# Patient Record
Sex: Female | Born: 1955 | Race: White | Hispanic: No | Marital: Married | Smoking: Never smoker
Health system: Southern US, Community
[De-identification: ages and names within clinical notes are randomized; demographics above are authoritative.]

## PROBLEM LIST (undated history)

## (undated) DIAGNOSIS — I868 Varicose veins of other specified sites: Secondary | ICD-10-CM

## (undated) DIAGNOSIS — M3500 Sicca syndrome, unspecified: Secondary | ICD-10-CM

## (undated) HISTORY — PX: WRIST FRACTURE SURGERY: SHX121

## (undated) HISTORY — PX: CATARACT EXTRACTION, BILATERAL: SHX1313

## (undated) HISTORY — PX: TUBAL LIGATION: SHX77

## (undated) HISTORY — PX: KNEE ARTHROSCOPY W/ MENISCAL REPAIR: SHX1877

## (undated) HISTORY — DX: Sjogren syndrome, unspecified: M35.00

## (undated) HISTORY — DX: Varicose veins of other specified sites: I86.8

## (undated) HISTORY — PX: CHOLECYSTECTOMY: SHX55

---

## 2020-08-31 LAB — HM MAMMOGRAPHY: HM Mammogram: NORMAL (ref 0–4)

## 2021-10-04 ENCOUNTER — Ambulatory Visit: Payer: 59 | Admitting: Family Medicine

## 2021-10-04 ENCOUNTER — Encounter: Payer: Self-pay | Admitting: Family Medicine

## 2021-10-04 ENCOUNTER — Ambulatory Visit (INDEPENDENT_AMBULATORY_CARE_PROVIDER_SITE_OTHER): Payer: 59 | Admitting: Family Medicine

## 2021-10-04 VITALS — BP 116/68 | HR 82 | Ht 67.0 in | Wt 147.8 lb

## 2021-10-04 DIAGNOSIS — M3500 Sicca syndrome, unspecified: Secondary | ICD-10-CM | POA: Diagnosis not present

## 2021-10-04 DIAGNOSIS — Z1211 Encounter for screening for malignant neoplasm of colon: Secondary | ICD-10-CM | POA: Diagnosis not present

## 2021-10-04 DIAGNOSIS — R12 Heartburn: Secondary | ICD-10-CM

## 2021-10-04 DIAGNOSIS — Z9189 Other specified personal risk factors, not elsewhere classified: Secondary | ICD-10-CM | POA: Diagnosis not present

## 2021-10-04 DIAGNOSIS — I83899 Varicose veins of unspecified lower extremities with other complications: Secondary | ICD-10-CM

## 2021-10-04 DIAGNOSIS — E559 Vitamin D deficiency, unspecified: Secondary | ICD-10-CM

## 2021-10-04 DIAGNOSIS — E038 Other specified hypothyroidism: Secondary | ICD-10-CM

## 2021-10-04 DIAGNOSIS — I868 Varicose veins of other specified sites: Secondary | ICD-10-CM

## 2021-10-04 MED ORDER — OMEPRAZOLE 40 MG PO CPDR: 40.0000 mg | DELAYED_RELEASE_CAPSULE | Freq: Every day | ORAL | 1 refills | Status: DC

## 2021-10-04 MED ORDER — LEVOTHYROXINE SODIUM 88 MCG PO TABS
88.0000 ug | ORAL_TABLET | Freq: Every day | ORAL | 1 refills | Status: DC
Start: 2021-10-04 — End: 2022-04-07

## 2021-10-04 MED ORDER — PREDNISONE 5 MG PO TABS: 5.0000 mg | ORAL_TABLET | Freq: Every day | ORAL | 0 refills | Status: DC

## 2021-10-04 NOTE — Patient Instructions (Addendum)
I appreciate the opportunity to provide care to you today!    Follow up:  3 months  Labs: will get labs at next visit (CBC, CMP, TSH, Lipid profile, HgA1c, Vit D)   Please pick up your medications at your pharmacy  Please stop by [facility] hospital anytime to get an x-ray of your bones (DEXA bone scan)   Referrals today- Rheumatologist and Vascular surgery    Please continue to a heart-healthy diet and increase your physical activities. Try to exercise for at least three times a week.      It was a pleasure to see you and I look forward to continuing to work together on your health and well-being. Please do not hesitate to call the office if you need care or have questions about your care.   Have a wonderful day and week. With Gratitude, Gilmore Laroche MSN, FNP-BC

## 2021-10-04 NOTE — Progress Notes (Unsigned)
New Patient Office Visit  Subjective:  Patient ID: Kimberly Singh, female    DOB: [birth year]  Age: 66 y.o. MRN: [id number]  CC:  Chief Complaint  Patient presents with   New Patient (Initial Visit)    Pt establishing care. Pt needs refills. Would like a general work up. Wants to discuss sjogrens flare ups. Has blood clot on right lower leg, not getting any better or worse would like to have this looked at.     HPI Kimberly Singh is a 66 y.o. female with past medical history of sjogrens syndrome presents for establishing care. [place] in supeficila bloot clot,  Dec 2,2022, went to urgent care and toook her to the Ed, told to apply warm compressess and went to her Md because it wasn't going away and staes that it want urgent.... 5 monthns.  RLQ -has spider vein in the feet that runs in the family, the veins in the feet itches Sensitive on arms and face, no rash  Has not followed up in a long up with a rheumatologist in a long time  Had labs 1 monthn ago Sjogrens flair Up:  last week Tuesday: took: topical cortizone cream and resulted ti prednizon tooks 1 in the morening and and took  cheecks get itcy and swwolllen, arms were itchy no dry eyes or dry mouth. eyes feelsl ike closing, rhemologist guve her renidison. Took last predsionn last 2days.    Had mammogram last year No past medical history on file.  *** The histories are not reviewed yet. Please review them in the "History" navigator section and refresh this SmartLink.  No family history on file.  Social History   Socioeconomic History   Marital status: Married    Spouse name: Not on file   Number of children: Not on file   Years of education: Not on file   Highest education level: Not on file  Occupational History   Not on file  Tobacco Use   Smoking status: Never   Smokeless tobacco: Never  Substance and Sexual Activity   Alcohol use: Never   Drug use: Never   Sexual activity: Not on file  Other Topics Concern   Not on  file  Social History Narrative   Not on file   Social Determinants of Health   Financial Resource Strain: Not on file  Food Insecurity: Not on file  Transportation Needs: Not on file  Physical Activity: Not on file  Stress: Not on file  Social Connections: Not on file  Intimate Partner Violence: Not on file    ROS Review of Systems  Constitutional:  Negative for chills, fatigue and fever.  HENT:  Negative for sinus pressure, sinus pain, sneezing and sore throat.   Eyes:  Negative for pain, discharge, redness, itching and visual disturbance.  Respiratory:  Negative for cough, choking, chest tightness and shortness of breath.   Cardiovascular:  Positive for palpitations (sometimes( none today)). Negative for chest pain.  Gastrointestinal:  Negative for constipation, diarrhea, nausea and vomiting.  Endocrine: Negative for polydipsia, polyphagia and polyuria.  Genitourinary:  Negative for dysuria and urgency.  Musculoskeletal:  Negative for back pain and neck pain.  Skin:  Positive for rash. Negative for wound.  Neurological:  Negative for dizziness, weakness, numbness and headaches.  Psychiatric/Behavioral:  Negative for confusion, self-injury, sleep disturbance and suicidal ideas.    Objective:   Today's Vitals: BP 116/68   Pulse 82   Ht 5\' 7"  (1.702 m)   Wt 147 lb  12.8 oz (67 kg)   SpO2 96%   BMI 23.15 kg/m   Physical Exam Neurological:     Mental Status: She is oriented to person, place, and time.    Assessment & Plan:   Problem List Items Addressed This Visit   None Visit Diagnoses     Colon cancer screening    -  Primary   Relevant Orders   Cologuard       Outpatient Encounter Medications as of 10/04/2021  Medication Sig   levothyroxine (SYNTHROID) 88 MCG tablet Take 88 mcg by mouth daily before breakfast.   losartan (COZAAR) 50 MG tablet Take 50 mg by mouth daily.   omeprazole (PRILOSEC) 40 MG capsule Take 40 mg by mouth daily.   predniSONE (DELTASONE)  5 MG tablet Take 5 mg by mouth daily with breakfast.   No facility-administered encounter medications on file as of 10/04/2021.    Follow-up: No follow-ups on file.   Gilmore Laroche, FNP

## 2021-10-06 ENCOUNTER — Encounter: Payer: Self-pay | Admitting: Family Medicine

## 2021-10-06 DIAGNOSIS — I868 Varicose veins of other specified sites: Secondary | ICD-10-CM | POA: Insufficient documentation

## 2021-10-06 DIAGNOSIS — M3500 Sicca syndrome, unspecified: Secondary | ICD-10-CM | POA: Insufficient documentation

## 2021-10-06 NOTE — Assessment & Plan Note (Signed)
-  given patient's history and assessment findings, will refer the patient to rheumatology for continuous follow-up of her condition

## 2021-10-06 NOTE — Assessment & Plan Note (Signed)
-  Assessment findings likely of ruptured varicose vein -Patient denies pain with ambulation, tenderness, swelling, and warmth. -Will refer the patient to vascular surgery for further evaluation

## 2021-10-12 ENCOUNTER — Other Ambulatory Visit: Payer: Self-pay

## 2021-10-12 ENCOUNTER — Telehealth: Payer: Self-pay

## 2021-10-12 DIAGNOSIS — M3500 Sicca syndrome, unspecified: Secondary | ICD-10-CM

## 2021-10-12 MED ORDER — PREDNISONE 5 MG PO TABS: 5.0000 mg | ORAL_TABLET | Freq: Every day | ORAL | 0 refills | Status: DC

## 2021-10-12 NOTE — Telephone Encounter (Signed)
yes

## 2021-10-12 NOTE — Telephone Encounter (Signed)
Patient need med refillpredniSONE (DELTASONE) 5 MG tablet  Had another flare, will upload photo in [place]  Pharmacy: [facility]

## 2021-10-12 NOTE — Telephone Encounter (Signed)
Pt informed

## 2021-10-12 NOTE — Telephone Encounter (Signed)
Ok to fill 

## 2021-10-13 ENCOUNTER — Ambulatory Visit (HOSPITAL_COMMUNITY)
Admission: RE | Admit: 2021-10-13 | Discharge: 2021-10-13 | Disposition: A | Discharge status: Home / Self Care | Payer: 59 | Source: Ambulatory Visit | Attending: Family Medicine | Admitting: Family Medicine

## 2021-10-13 DIAGNOSIS — Z9189 Other specified personal risk factors, not elsewhere classified: Secondary | ICD-10-CM | POA: Insufficient documentation

## 2021-10-14 NOTE — Progress Notes (Signed)
Please, inform the patient that her Dexa Scan shows that she's osteopenic. I recommend OTC vit D 600iu daily and Calcium 1200mg  daily for bone health.

## 2021-10-30 ENCOUNTER — Other Ambulatory Visit: Payer: Self-pay

## 2021-10-30 DIAGNOSIS — I868 Varicose veins of other specified sites: Secondary | ICD-10-CM

## 2021-11-17 ENCOUNTER — Ambulatory Visit (INDEPENDENT_AMBULATORY_CARE_PROVIDER_SITE_OTHER): Payer: Medicare Other | Admitting: Family Medicine

## 2021-11-17 ENCOUNTER — Encounter: Payer: Self-pay | Admitting: Family Medicine

## 2021-11-17 VITALS — BP 148/84 | HR 83 | Ht 67.5 in | Wt 144.8 lb

## 2021-11-17 DIAGNOSIS — M3500 Sicca syndrome, unspecified: Secondary | ICD-10-CM | POA: Diagnosis not present

## 2021-11-17 DIAGNOSIS — L299 Pruritus, unspecified: Secondary | ICD-10-CM

## 2021-11-17 DIAGNOSIS — I868 Varicose veins of other specified sites: Secondary | ICD-10-CM

## 2021-11-17 DIAGNOSIS — R21 Rash and other nonspecific skin eruption: Secondary | ICD-10-CM | POA: Diagnosis not present

## 2021-11-17 MED ORDER — PREDNISONE 5 MG PO TABS: 5.0000 mg | ORAL_TABLET | Freq: Every day | ORAL | 0 refills | Status: DC

## 2021-11-17 MED ORDER — HYDROXYZINE PAMOATE 25 MG PO CAPS: 25.0000 mg | ORAL_CAPSULE | Freq: Three times a day (TID) | ORAL | 0 refills | Status: DC | PRN

## 2021-11-17 NOTE — Patient Instructions (Signed)
I appreciate the opportunity to provide care to you today!    Follow up:  1 month CPE  Please pick up your medications at the pharmacy   Please continue to a heart-healthy diet and increase your physical activities. Try to exercise for at least three times a week.      It was a pleasure to see you and I look forward to continuing to work together on your health and well-being. Please do not hesitate to call the office if you need care or have questions about your care.   Have a wonderful day and week. With Gratitude, Gilmore Laroche MSN, FNP-BC

## 2021-11-17 NOTE — Progress Notes (Signed)
Established Patient Office Visit  Subjective:  Patient ID: Kimberly Singh, female    DOB: 1955/08/19  Age: 66 y.o. MRN: [id number]  CC:  Chief Complaint  Patient presents with   Rash    Pt c/o of flare of rash from being out in the sun.     HPI Kimberly Singh is a 66 y.o. female with past medical history of sjogren's syndrome presents  with c/o of pruritic rash on her face, arms, and body from being in the sun. The rash has been resolved. The onset of symptoms was on November 08, 2021. He noted having heat hives on her body. She applied Kenalog cream and itch lotion to relieve symptoms. She reports to have completed her oral prednisone and will like a refill.  Past Medical History:  Diagnosis Date   Sjogren's syndrome ([place])    Spider varicose veins     History reviewed. No pertinent surgical history.  History reviewed. No pertinent family history.  Social History   Socioeconomic History   Marital status: Married    Spouse name: Not on file   Number of children: Not on file   Years of education: Not on file   Highest education level: Not on file  Occupational History   Not on file  Tobacco Use   Smoking status: Never   Smokeless tobacco: Never  Substance and Sexual Activity   Alcohol use: Never   Drug use: Never   Sexual activity: Not on file  Other Topics Concern   Not on file  Social History Narrative   Not on file   Social Determinants of Health   Financial Resource Strain: Not on file  Food Insecurity: Not on file  Transportation Needs: Not on file  Physical Activity: Not on file  Stress: Not on file  Social Connections: Not on file  Intimate Partner Violence: Not on file    Outpatient Medications Prior to Visit  Medication Sig Dispense Refill   camphor-menthol (SARNA) lotion Apply 1 Application topically as needed for itching.     levothyroxine (SYNTHROID) 88 MCG tablet Take 1 tablet (88 mcg total) by mouth daily before breakfast. 90 tablet 1   losartan  (COZAAR) 50 MG tablet Take 50 mg by mouth daily.     omeprazole (PRILOSEC) 40 MG capsule Take 1 capsule (40 mg total) by mouth daily. 90 capsule 1   triamcinolone cream (KENALOG) 0.1 % Apply 1 Application topically 2 (two) times daily.     predniSONE (DELTASONE) 5 MG tablet Take 1 tablet (5 mg total) by mouth daily with breakfast. 10 tablet 0   No facility-administered medications prior to visit.    No Known Allergies  ROS Review of Systems  Constitutional:  Negative for fatigue and fever.  Respiratory:  Positive for chest tightness. Negative for shortness of breath.   Skin:  Positive for rash.      Objective:    Physical Exam HENT:     Head: Normocephalic.  Cardiovascular:     Rate and Rhythm: Normal rate and regular rhythm.     Pulses: Normal pulses.     Heart sounds: Normal heart sounds.  Pulmonary:     Effort: Pulmonary effort is normal.     Breath sounds: Normal breath sounds.  Skin:    Findings: No rash.  Neurological:     Mental Status: She is alert.     BP (!) 148/84   Pulse 83   Ht 5' 7.5" (1.715 m)   Wt 144  lb 12.8 oz (65.7 kg)   SpO2 99%   BMI 22.34 kg/m  Wt Readings from Last 3 Encounters:  11/17/21 144 lb 12.8 oz (65.7 kg)  10/04/21 147 lb 12.8 oz (67 kg)    No results found for: "TSH" No results found for: "WBC", "HGB", "HCT", "MCV", "PLT" No results found for: "NA", "K", "CHLORIDE", "CO2", "GLUCOSE", "BUN", "CREATININE", "BILITOT", "ALKPHOS", "AST", "ALT", "PROT", "ALBUMIN", "CALCIUM", "ANIONGAP", "EGFR", "GFR" No results found for: "CHOL" No results found for: "HDL" No results found for: "LDLCALC" No results found for: "TRIG" No results found for: "CHOLHDL" No results found for: "HGBA1C"    Assessment & Plan:   Problem List Items Addressed This Visit       Cardiovascular and Mediastinum   Spider varicose veins    F/u with vascular surgery on 11/29/21        Musculoskeletal and Integument   Rash of body    Noted having a pruritic  rash on her face, arms, and body from being in the sun on November 08, 2021 The rash has been resolved She applied Kenalog cream and itch lotion to relieve the symptoms Will order Hydroxyzine 25 mg to treat skin rash, hives and itching        Other   Sjogren's syndrome ([place])    Report faxing medical records to the rheumatologist's office Will be f/u with rheumatology soon Refilled prednisone       Relevant Medications   predniSONE (DELTASONE) 5 MG tablet   Other Visit Diagnoses     Pruritic dermatitis    -  Primary   Relevant Medications   hydrOXYzine (VISTARIL) 25 MG capsule       Meds ordered this encounter  Medications   hydrOXYzine (VISTARIL) 25 MG capsule    Sig: Take 1 capsule (25 mg total) by mouth every 8 (eight) hours as needed.    Dispense:  30 capsule    Refill:  0   predniSONE (DELTASONE) 5 MG tablet    Sig: Take 1 tablet (5 mg total) by mouth daily with breakfast.    Dispense:  5 tablet    Refill:  0    Follow-up: Return in about 1 month (around 12/18/2021) for CPE.    Alvira Monday, FNP

## 2021-11-17 NOTE — Assessment & Plan Note (Signed)
F/u with vascular surgery on 11/29/21

## 2021-11-17 NOTE — Assessment & Plan Note (Signed)
Report faxing medical records to the rheumatologist's office Will be f/u with rheumatology soon Refilled prednisone

## 2021-11-17 NOTE — Assessment & Plan Note (Addendum)
Noted having a pruritic rash on her face, arms, and body from being in the sun on November 08, 2021 The rash has been resolved She applied Kenalog cream and itch lotion to relieve the symptoms Will order Hydroxyzine 25 mg to treat skin rash, hives and itching

## 2021-11-24 ENCOUNTER — Encounter: Payer: Self-pay | Admitting: Family Medicine

## 2021-11-25 ENCOUNTER — Ambulatory Visit (INDEPENDENT_AMBULATORY_CARE_PROVIDER_SITE_OTHER): Payer: Medicare Other | Admitting: Nurse Practitioner

## 2021-11-25 ENCOUNTER — Encounter: Payer: Self-pay | Admitting: Nurse Practitioner

## 2021-11-25 DIAGNOSIS — A601 Herpesviral infection of perianal skin and rectum: Secondary | ICD-10-CM

## 2021-11-25 DIAGNOSIS — A6000 Herpesviral infection of urogenital system, unspecified: Secondary | ICD-10-CM | POA: Insufficient documentation

## 2021-11-25 DIAGNOSIS — B009 Herpesviral infection, unspecified: Secondary | ICD-10-CM | POA: Insufficient documentation

## 2021-11-25 MED ORDER — ACYCLOVIR 400 MG PO TABS: 400.0000 mg | ORAL_TABLET | Freq: Three times a day (TID) | ORAL | 0 refills | Status: DC

## 2021-11-25 NOTE — Assessment & Plan Note (Signed)
Rx Zovirax 400 mg 3 times daily for 10 days Use of cold compresses and sits baths encouraged. Patient told to call the office if her symptoms does not improve after completion of full course of Zovirax

## 2021-11-25 NOTE — Progress Notes (Addendum)
   Kimberly Singh     MRN: [id number]      DOB: [birth year]   HPI Kimberly Singh with past medical history of genital herpes, sjogrens syndrome is here for complaints of genital herpes since about a week ago.  Patient stated that she does have history of genital herpes and that her last episode prior to now  was around 3 years ago.  Patient denies fever, chills, malaise.  States that she initially had blisters on her anal area , she has been applying cold compress to her anal area  and taking ibuprofen as needed.        ROS Denies recent fever or chills. Denies sinus pressure, nasal congestion, ear pain or sore throat. Denies chest congestion, productive cough or wheezing. Denies chest pains, palpitations and leg swelling Denies abdominal pain, nausea, vomiting,diarrhea or constipation.   Denies dysuria, frequency, hesitancy or incontinence. Denies joint pain, swelling and limitation in mobility. Denies headaches, seizures, numbness, or tingling. Denies depression, anxiety or insomnia.    PE  BP (!) 148/74 (BP Location: Right Arm, Cuff Size: Normal)   Pulse 72   Ht 5\' 7"  (1.702 m)   Wt 146 lb (66.2 kg)   SpO2 98%   BMI 22.87 kg/m   CMA present as chaperone  Patient alert and oriented and in no cardiopulmonary distress.  Chest: Clear to auscultation bilaterally.  CVS: S1, S2 no murmurs, no S3.Regular rate.  ABD: Soft non tender.   Ext: No edema  MS: Adequate ROM spine, shoulders, hips and knees.  Skin:  skin around the anus appears moist and erythematous, no blisters or drainage noted.   Psych: Good eye contact, normal affect. Memory intact not anxious or depressed appearing.  CNS: CN 2-12 intact, power,  normal throughout.no focal deficits noted.   Assessment & Plan  Herpes genitalia Rx Zovirax 400 mg 3 times daily for 10 days Use of cold compresses and sits baths encouraged. Patient told to call the office if her symptoms does not improve after completion of  full course of Zovirax

## 2021-11-25 NOTE — Patient Instructions (Addendum)
   Please take acyclovir 400mg  three times daily for 10 days.   Apply cool compress , cool perineal baths and sitsz bath as needed .     Thanks for choosing [facility], we consider it a privelige to serve you.

## 2021-11-28 NOTE — Progress Notes (Unsigned)
[facility] [place]  ASSESSMENT / PLAN: Kimberly Singh is a 66 y.o. female with chronic venous insufficiency of right lower extremity causing spontaneous bleeding from reticular veins Venous duplex is significant for GSV reflux in the thigh, knee, and calf. Recommend compression and elevation for symptomatic relief. Follow up with [facility] in three months to discuss vein interventions available for her.  CHIEF COMPLAINT: Spontaneous bleeding from right ankle reticular vein  HISTORY OF PRESENT ILLNESS: Kimberly Singh is a 66 y.o. female referred to clinic for evaluation of chronic venous insufficiency.  The patient reports she had an episode of spontaneous bleeding from her right ankle in the area of prominent reticular veins.  She has suffered with chronic venous insufficiency for some time.  She is bothered by the appearance of her legs.  She has tried compression, but has not tried fitted compression and finds the compression uncomfortable.  She has tried elevating her legs and this does provide her some relief.  Past Medical History:  Diagnosis Date   Sjogren's syndrome ([facility])    Spider varicose veins     History reviewed. No pertinent surgical history.  History reviewed. No pertinent family history.  Social History   Socioeconomic History   Marital status: Married    Spouse name: Not on file   Number of children: Not on file   Years of education: Not on file   Highest education level: Not on file  Occupational History   Not on file  Tobacco Use   Smoking status: Never   Smokeless tobacco: Never  Substance and Sexual Activity   Alcohol use: Never   Drug use: Never   Sexual activity: Not on file  Other Topics Concern   Not on file  Social History Narrative   Not on file   Social Determinants of Health   Financial Resource Strain: Not on file  Food Insecurity: Not on file  Transportation Needs: Not on file  Physical Activity: Not on file  Stress: Not  on file  Social Connections: Not on file  Intimate Partner Violence: Not on file    No Known Allergies  Current Outpatient Medications  Medication Sig Dispense Refill   acyclovir (ZOVIRAX) 400 MG tablet Take 1 tablet (400 mg total) by mouth 3 (three) times daily for 10 days. 30 tablet 0   Ascorbic Acid (VITAMIN C PO) Take by mouth. Once daily     camphor-menthol (SARNA) lotion Apply 1 Application topically as needed for itching.     Cholecalciferol (VITAMIN D3 PO) Take by mouth. Once daily     hydrOXYzine (VISTARIL) 25 MG capsule Take 1 capsule (25 mg total) by mouth every 8 (eight) hours as needed. 30 capsule 0   levothyroxine (SYNTHROID) 88 MCG tablet Take 1 tablet (88 mcg total) by mouth daily before breakfast. 90 tablet 1   losartan (COZAAR) 50 MG tablet Take 50 mg by mouth daily.     Multiple Vitamin (MULTIVITAMIN ADULT PO) Take by mouth. daily     omeprazole (PRILOSEC) 40 MG capsule Take 1 capsule (40 mg total) by mouth daily. 90 capsule 1   VITAMIN E PO Take by mouth. Once daily     predniSONE (DELTASONE) 5 MG tablet Take 1 tablet (5 mg total) by mouth daily with breakfast. (Patient not taking: Reported on 11/29/2021) 5 tablet 0   triamcinolone cream (KENALOG) 0.1 % Apply 1 Application topically 2 (two) times daily. (Patient not taking: Reported on 11/29/2021)     No current  facility-administered medications for this visit.    PHYSICAL EXAM Vitals:   11/29/21 1511  BP: (!) 149/67  Pulse: 80  Resp: 20  Temp: 98.3 F (36.8 C)  SpO2: 100%  Weight: 145 lb (65.8 kg)  Height: 5\' 7"  (1.702 m)    Appearing woman in no acute distress Regular rate and rhythm Unlabored breathing Soft, nontender abdomen Prominent venous varicosities, reticular veins about both lower extremities.  The skin over the right medial malleolus has dyspigmentation consistent with hemosiderin staining.  PERTINENT LABORATORY AND RADIOLOGIC DATA  Right lower extremity venous duplex:  - No evidence of  deep vein thrombosis seen in the right lower extremity,  from the common femoral through the popliteal veins.  - No evidence of superficial venous thrombosis in the right lower  extremity.     - Venous reflux is noted in the right greater saphenous vein in the thigh.  - Venous reflux is noted in the right greater saphenous vein in the calf.  - Venous reflux is noted in the right popliteal vein.  - Venous reflux is noted in the right perforator vein.   . Rande Brunt, MD [facility] Phone Number: [phone] 11/29/2021 4:23 PM  Total time spent on preparing this encounter including chart review, data review, collecting history, examining the patient, coordinating care for this new patient, 60 minutes.  Portions of this report may have been transcribed using voice recognition software.  Every effort has been made to ensure accuracy; however, inadvertent computerized transcription errors may still be present.

## 2021-11-29 ENCOUNTER — Ambulatory Visit (HOSPITAL_COMMUNITY)
Admission: RE | Admit: 2021-11-29 | Discharge: 2021-11-29 | Disposition: A | Discharge status: Home / Self Care | Payer: Medicare Other | Source: Ambulatory Visit | Attending: Vascular Surgery | Admitting: Vascular Surgery

## 2021-11-29 ENCOUNTER — Encounter: Payer: Self-pay | Admitting: Vascular Surgery

## 2021-11-29 ENCOUNTER — Ambulatory Visit (INDEPENDENT_AMBULATORY_CARE_PROVIDER_SITE_OTHER): Payer: Medicare Other | Admitting: Vascular Surgery

## 2021-11-29 VITALS — BP 149/67 | HR 80 | Temp 98.3°F | Resp 20 | Ht 67.0 in | Wt 145.0 lb

## 2021-11-29 DIAGNOSIS — I872 Venous insufficiency (chronic) (peripheral): Secondary | ICD-10-CM | POA: Diagnosis not present

## 2021-11-29 DIAGNOSIS — I868 Varicose veins of other specified sites: Secondary | ICD-10-CM | POA: Diagnosis present

## 2021-12-06 ENCOUNTER — Telehealth: Payer: Self-pay

## 2021-12-06 NOTE — Telephone Encounter (Signed)
Pt called stating that she was experiencing periodic itching at the spider veins on her ankles. She's tried several things with no success.  Reviewed pt's chart, returned pt's call for clarification, two identifiers used. Pt stated that she's used lotions, surgeon's skin, and a non-steroidal itch cream. She denies leg dryness.  Spoke with Dr Lenell Antu for additional recommendations. He advised the pt to try a steroidal itch cream. Called pt and informed her of advice. Confirmed understanding.

## 2021-12-19 ENCOUNTER — Other Ambulatory Visit: Payer: Self-pay

## 2021-12-19 ENCOUNTER — Telehealth: Payer: Self-pay | Admitting: Family Medicine

## 2021-12-19 DIAGNOSIS — I10 Essential (primary) hypertension: Secondary | ICD-10-CM

## 2021-12-19 DIAGNOSIS — I270 Primary pulmonary hypertension: Secondary | ICD-10-CM

## 2021-12-19 DIAGNOSIS — R12 Heartburn: Secondary | ICD-10-CM

## 2021-12-19 MED ORDER — LOSARTAN POTASSIUM 50 MG PO TABS: 50.0000 mg | ORAL_TABLET | Freq: Every day | ORAL | 1 refills | Status: DC

## 2021-12-19 NOTE — Telephone Encounter (Signed)
Attempted to reach pt unable to get a hold of her.

## 2021-12-19 NOTE — Telephone Encounter (Signed)
Pt called stating she is needing a refill on . She is completely out. She was prescibed these from her dr in [place]. She is scheduled to see [place] on 01/04/22. Can you please refill or either give her enough to last till her appt?       [facility] [address]

## 2021-12-20 NOTE — Telephone Encounter (Signed)
Spoke to pt she was needing refill for htn medication, rx sent.

## 2021-12-21 ENCOUNTER — Telehealth: Payer: Self-pay

## 2021-12-21 NOTE — Telephone Encounter (Signed)
Patient will come by the office and sign a release to get her mammogram on  04.26.2022 from [place].  Per patient she is age 66 year and older do it every 2 years.

## 2021-12-21 NOTE — Telephone Encounter (Signed)
Noted  

## 2021-12-26 ENCOUNTER — Telehealth: Payer: Self-pay | Admitting: Family Medicine

## 2021-12-26 NOTE — Telephone Encounter (Signed)
Error

## 2022-01-04 ENCOUNTER — Ambulatory Visit (INDEPENDENT_AMBULATORY_CARE_PROVIDER_SITE_OTHER): Payer: Medicare Other | Admitting: Family Medicine

## 2022-01-04 ENCOUNTER — Encounter: Payer: Self-pay | Admitting: Family Medicine

## 2022-01-04 VITALS — BP 124/79 | HR 79 | Ht 67.0 in | Wt 144.1 lb

## 2022-01-04 DIAGNOSIS — M25511 Pain in right shoulder: Secondary | ICD-10-CM

## 2022-01-04 DIAGNOSIS — M3500 Sicca syndrome, unspecified: Secondary | ICD-10-CM

## 2022-01-04 DIAGNOSIS — G8929 Other chronic pain: Secondary | ICD-10-CM | POA: Insufficient documentation

## 2022-01-04 DIAGNOSIS — R7301 Impaired fasting glucose: Secondary | ICD-10-CM

## 2022-01-04 DIAGNOSIS — Z1159 Encounter for screening for other viral diseases: Secondary | ICD-10-CM

## 2022-01-04 DIAGNOSIS — E785 Hyperlipidemia, unspecified: Secondary | ICD-10-CM | POA: Diagnosis not present

## 2022-01-04 DIAGNOSIS — I868 Varicose veins of other specified sites: Secondary | ICD-10-CM

## 2022-01-04 DIAGNOSIS — E559 Vitamin D deficiency, unspecified: Secondary | ICD-10-CM

## 2022-01-04 MED ORDER — DICLOFENAC SODIUM 1 % EX GEL: 2.0000 g | Freq: Four times a day (QID) | CUTANEOUS | 0 refills | Status: DC

## 2022-01-04 NOTE — Assessment & Plan Note (Signed)
Reports right shoulder pain from repetitive movement of the right shoulder from painting her home this weekend Pain is worse at night time and with overhead movements She has been increasing her physical activities by working out at the gym and has not rested the affected arm since the pain onset Reports hx of bursitis of the left shoulder Recommended avoiding activities that aggravates the pain, including overhead activities Recommended oral NSAIDs for pain management Topical voltaren gel for pain relief

## 2022-01-04 NOTE — Progress Notes (Unsigned)
Documentation

## 2022-01-04 NOTE — Assessment & Plan Note (Signed)
She noted that she has not yet followed up with rheumatology because her records have not been released from [place] She denies recent skin rashes, joint pain, dry eyes, and dry mouth Will continue to monitor

## 2022-01-04 NOTE — Patient Instructions (Signed)
I appreciate the opportunity to provide care to you today!    Follow up:  3 months CPE  Labs: please stop by the lab tomorrow to get your blood drawn (CBC, CMP, TSH, Lipid profile, HgA1c, Vit D)     Please continue to a heart-healthy diet and increase your physical activities. Try to exercise for at least three times a week.      It was a pleasure to see you and I look forward to continuing to work together on your health and well-being. Please do not hesitate to call the office if you need care or have questions about your care.   Have a wonderful day and week. With Gratitude, Gilmore Laroche MSN, FNP-BC

## 2022-01-04 NOTE — Progress Notes (Addendum)
Established Patient Office Visit  Subjective:  Patient ID: Kimberly Singh, female    DOB: 05/03/1956  Age: 66 y.o. MRN: [id number]  CC:  Chief Complaint  Patient presents with   Follow-up    3 month f/u, pt states she went to see vascular doctor.    HPI Kimberly Singh is a 66 y.o. female with past medical history of sjogren's syndrome  presents for f/u of  chronic medical conditions. Sjogren's syndrome: She noted that she has not yet followed up with rheumatology because her records have not been released from [place]. She denies recent skin rashes, joint pain, dry eyes, and dry mouth.  Spider varicose veins: She reported following up with vascular surgery and had a u/s of her lower extremities. No evidence of DVT was noted in imaging studies, and the patient was advised to wear compression stockings.   Right shoulder pain: Reports right shoulder pain from repetitive movement of the  right shoulder from painting her home this weekend. Pain is worse at night time and with overhead movements. She has been increasing her physical activities by working out at the gym and has not rested the affected arm since the pain onset.    Past Medical History:  Diagnosis Date   Sjogren's syndrome ([place])    Spider varicose veins     History reviewed. No pertinent surgical history.  History reviewed. No pertinent family history.  Social History   Socioeconomic History   Marital status: Married    Spouse name: Not on file   Number of children: Not on file   Years of education: Not on file   Highest education level: Not on file  Occupational History   Not on file  Tobacco Use   Smoking status: Never   Smokeless tobacco: Never  Substance and Sexual Activity   Alcohol use: Never   Drug use: Never   Sexual activity: Not on file  Other Topics Concern   Not on file  Social History Narrative   Not on file   Social Determinants of Health   Financial Resource Strain: Not on file  Food  Insecurity: Not on file  Transportation Needs: Not on file  Physical Activity: Not on file  Stress: Not on file  Social Connections: Not on file  Intimate Partner Violence: Not on file    Outpatient Medications Prior to Visit  Medication Sig Dispense Refill   Ascorbic Acid (VITAMIN C PO) Take 500 mg by mouth. Once daily     camphor-menthol (SARNA) lotion Apply 1 Application topically as needed for itching.     Cholecalciferol (VITAMIN D3 PO) Take 5,000 Units by mouth. Once daily     hydrOXYzine (VISTARIL) 25 MG capsule Take 1 capsule (25 mg total) by mouth every 8 (eight) hours as needed. 30 capsule 0   levothyroxine (SYNTHROID) 88 MCG tablet Take 1 tablet (88 mcg total) by mouth daily before breakfast. 90 tablet 1   losartan (COZAAR) 50 MG tablet Take 1 tablet (50 mg total) by mouth daily. 30 tablet 1   Multiple Vitamin (MULTIVITAMIN ADULT PO) Take by mouth. daily     omeprazole (PRILOSEC) 40 MG capsule Take 1 capsule (40 mg total) by mouth daily. 90 capsule 1   predniSONE (DELTASONE) 5 MG tablet Take 1 tablet (5 mg total) by mouth daily with breakfast. 5 tablet 0   triamcinolone cream (KENALOG) 0.1 % Apply 1 Application topically 2 (two) times daily.     VITAMIN E PO Take 400 Units  by mouth. Once daily     No facility-administered medications prior to visit.    No Known Allergies  ROS Review of Systems  Constitutional:  Negative for fatigue and fever.  Respiratory:  Negative for chest tightness and shortness of breath.   Cardiovascular:  Negative for chest pain and palpitations.  Musculoskeletal:        Right shoulder pain  Neurological:  Negative for numbness and headaches.      Objective:    Physical Exam Cardiovascular:     Rate and Rhythm: Normal rate and regular rhythm.     Pulses: Normal pulses.     Heart sounds: Normal heart sounds.  Pulmonary:     Effort: Pulmonary effort is normal.     Breath sounds: Normal breath sounds.  Musculoskeletal:     Comments:  Pain noted with 180 degree flexion  Neurological:     Mental Status: She is alert.     BP 124/79   Pulse 79   Ht _0  (1.702 m)   Wt 144 lb 1.3 oz (65.4 kg)   SpO2 97%   BMI 22.57 kg/m  Wt Readings from Last 3 Encounters:  01/04/22 144 lb 1.3 oz (65.4 kg)  11/29/21 145 lb (65.8 kg)  11/25/21 146 lb (66.2 kg)    No results found for: "TSH" No results found for: "WBC", "HGB", "HCT", "MCV", "PLT" No results found for: "NA", "K", "CHLORIDE", "CO2", "GLUCOSE", "BUN", "CREATININE", "BILITOT", "ALKPHOS", "AST", "ALT", "PROT", "ALBUMIN", "CALCIUM", "ANIONGAP", "EGFR", "GFR" No results found for: "CHOL" No results found for: "HDL" No results found for: "LDLCALC" No results found for: "TRIG" No results found for: "CHOLHDL" No results found for: "HGBA1C"    Assessment & Plan:   Problem List Items Addressed This Visit       Cardiovascular and Mediastinum   Spider varicose veins    She reported following up with vascular surgery (Dr. Leitha Bleak) and had a u/s of her lower extremities No evidence of DVT was noted in imaging studies, and the patient was advised to wear compression stockings She voices no complaints or concerns today         Other   Sjogren's syndrome ([place]) - Primary    She noted that she has not yet followed up with rheumatology because her records have not been released from [place] She denies recent skin rashes, joint pain, dry eyes, and dry mouth Will continue to monitor      Right shoulder pain    Reports right shoulder pain from repetitive movement of the right shoulder from painting her home this weekend Pain is worse at night time and with overhead movements She has been increasing her physical activities by working out at the gym and has not rested the affected arm since the pain onset Reports hx of bursitis of the left shoulder Recommended avoiding activities that aggravates the pain, including overhead activities Recommended oral NSAIDs for  pain management Topical voltaren gel for pain relief      Other Visit Diagnoses     Vitamin D deficiency       Relevant Orders   Vitamin D (25 hydroxy)   IFG (impaired fasting glucose)       Relevant Orders   CBC with Differential/Platelet   CMP14+EGFR   TSH + free T4   Hemoglobin A1C   Dyslipidemia       Relevant Orders   Lipid Profile   Encounter for hepatitis C screening test for low risk patient  Relevant Orders   Hepatitis C antibody       Meds ordered this encounter  Medications   diclofenac Sodium (VOLTAREN ARTHRITIS PAIN) 1 % GEL    Sig: Apply 2 g topically 4 (four) times daily.    Dispense:  2 g    Refill:  0    Follow-up: Return in about 3 months (around 04/06/2022) for CPE.    Alvira Monday, FNP

## 2022-01-04 NOTE — Addendum Note (Signed)
Addended byGilmore Laroche on: 01/04/2022 01:51 PM   Modules accepted: Orders

## 2022-01-04 NOTE — Assessment & Plan Note (Signed)
She reported following up with vascular surgery (Dr. Diona Fanti) and had a u/s of her lower extremities No evidence of DVT was noted in imaging studies, and the patient was advised to wear compression stockings She voices no complaints or concerns today

## 2022-01-06 LAB — CBC WITH DIFFERENTIAL/PLATELET
Basophils Absolute: 0 10*3/uL (ref 0.0–0.2)
Basos: 1 %
EOS (ABSOLUTE): 0.1 10*3/uL (ref 0.0–0.4)
Eos: 3 %
Hematocrit: 37.4 % (ref 34.0–46.6)
Hemoglobin: 12.5 g/dL (ref 11.1–15.9)
Immature Grans (Abs): 0 10*3/uL (ref 0.0–0.1)
Immature Granulocytes: 0 %
Lymphocytes Absolute: 1.4 10*3/uL (ref 0.7–3.1)
Lymphs: 35 %
MCH: 30.8 pg (ref 26.6–33.0)
MCHC: 33.4 g/dL (ref 31.5–35.7)
MCV: 92 fL (ref 79–97)
Monocytes Absolute: 0.4 10*3/uL (ref 0.1–0.9)
Monocytes: 10 %
Neutrophils Absolute: 2.2 10*3/uL (ref 1.4–7.0)
Neutrophils: 51 %
Platelets: 289 10*3/uL (ref 150–450)
RBC: 4.06 x10E6/uL (ref 3.77–5.28)
RDW: 13 % (ref 11.7–15.4)
WBC: 4.1 10*3/uL (ref 3.4–10.8)

## 2022-01-06 LAB — HEPATITIS C ANTIBODY: Hep C Virus Ab: NONREACTIVE

## 2022-01-06 LAB — CMP14+EGFR
ALT: 19 IU/L (ref 0–32)
AST: 27 IU/L (ref 0–40)
Albumin/Globulin Ratio: 1.5 (ref 1.2–2.2)
Albumin: 4.3 g/dL (ref 3.9–4.9)
Alkaline Phosphatase: 120 IU/L (ref 44–121)
BUN/Creatinine Ratio: 21 (ref 12–28)
BUN: 20 mg/dL (ref 8–27)
Bilirubin Total: 0.3 mg/dL (ref 0.0–1.2)
CO2: 24 mmol/L (ref 20–29)
Calcium: 8.9 mg/dL (ref 8.7–10.3)
Chloride: 102 mmol/L (ref 96–106)
Creatinine, Ser: 0.95 mg/dL (ref 0.57–1.00)
Globulin, Total: 2.9 g/dL (ref 1.5–4.5)
Glucose: 84 mg/dL (ref 70–99)
Potassium: 4.9 mmol/L (ref 3.5–5.2)
Sodium: 137 mmol/L (ref 134–144)
Total Protein: 7.2 g/dL (ref 6.0–8.5)
eGFR: 66 mL/min/{1.73_m2} (ref >59)

## 2022-01-06 LAB — LIPID PANEL
Chol/HDL Ratio: 3.1 ratio (ref 0.0–4.4)
Cholesterol, Total: 176 mg/dL (ref 100–199)
HDL: 57 mg/dL (ref >39)
LDL Chol Calc (NIH): 107 mg/dL — ABNORMAL HIGH (ref 0–99)
Triglycerides: 63 mg/dL (ref 0–149)
VLDL Cholesterol Cal: 12 mg/dL (ref 5–40)

## 2022-01-06 LAB — HEMOGLOBIN A1C
Est. average glucose Bld gHb Est-mCnc: 123 mg/dL
Hgb A1c MFr Bld: 5.9 % — ABNORMAL HIGH (ref 4.8–5.6)

## 2022-01-06 LAB — TSH+FREE T4
Free T4: 1.43 ng/dL (ref 0.82–1.77)
TSH: 4.5 u[IU]/mL (ref 0.450–4.500)

## 2022-01-06 LAB — VITAMIN D 25 HYDROXY (VIT D DEFICIENCY, FRACTURES): Vit D, 25-Hydroxy: 50.1 ng/mL (ref 30.0–100.0)

## 2022-01-06 NOTE — Progress Notes (Signed)
Please inform the patient that she has prediabetes. I want her LDL <100. I recommend low carbs, fat, and sugary foods and increased physical activities.

## 2022-01-12 ENCOUNTER — Telehealth: Payer: Self-pay

## 2022-01-12 NOTE — Telephone Encounter (Signed)
Patient need med refill.  losartan (COZAAR) 50 MG tablet     Pharmacy  [id number] - [place], [place] - [address]  [address], [place] [address]  Phone:  [phone]  Fax:  [phone]

## 2022-01-13 ENCOUNTER — Other Ambulatory Visit: Payer: Self-pay

## 2022-01-13 ENCOUNTER — Other Ambulatory Visit: Payer: Self-pay | Admitting: Family Medicine

## 2022-01-13 DIAGNOSIS — I10 Essential (primary) hypertension: Secondary | ICD-10-CM

## 2022-01-13 MED ORDER — LOSARTAN POTASSIUM 50 MG PO TABS: 50.0000 mg | ORAL_TABLET | Freq: Every day | ORAL | 1 refills | Status: DC

## 2022-01-13 NOTE — Telephone Encounter (Signed)
Rx sent 

## 2022-01-20 ENCOUNTER — Encounter: Payer: Self-pay | Admitting: Family Medicine

## 2022-01-20 ENCOUNTER — Ambulatory Visit (INDEPENDENT_AMBULATORY_CARE_PROVIDER_SITE_OTHER): Payer: No Typology Code available for payment source | Admitting: Family Medicine

## 2022-01-20 VITALS — BP 133/82 | HR 82 | Ht 67.5 in | Wt 146.1 lb

## 2022-01-20 DIAGNOSIS — G8929 Other chronic pain: Secondary | ICD-10-CM | POA: Diagnosis not present

## 2022-01-20 DIAGNOSIS — Z23 Encounter for immunization: Secondary | ICD-10-CM | POA: Insufficient documentation

## 2022-01-20 DIAGNOSIS — M25511 Pain in right shoulder: Secondary | ICD-10-CM | POA: Diagnosis not present

## 2022-01-20 DIAGNOSIS — Z0001 Encounter for general adult medical examination with abnormal findings: Secondary | ICD-10-CM | POA: Diagnosis not present

## 2022-01-20 DIAGNOSIS — I10 Essential (primary) hypertension: Secondary | ICD-10-CM | POA: Diagnosis not present

## 2022-01-20 DIAGNOSIS — Z0000 Encounter for general adult medical examination without abnormal findings: Secondary | ICD-10-CM

## 2022-01-20 NOTE — Progress Notes (Deleted)
Subjective:    Kimberly Singh is a 66 y.o. female who presents for a Welcome to Medicare exam.   Review of Systems ***        Objective:    Today's Vitals   01/20/22 1016  BP: 133/82  Pulse: 82  SpO2: 98%  Weight: 146 lb 1.3 oz (66.3 kg)  Height: 5' 7.5" (1.715 m)  PainSc: 0-No pain  Body mass index is 22.54 kg/m.  Medications Outpatient Encounter Medications as of 01/20/2022  Medication Sig   Ascorbic Acid (VITAMIN C PO) Take 500 mg by mouth. Once daily   camphor-menthol (SARNA) lotion Apply 1 Application topically as needed for itching.   Cholecalciferol (VITAMIN D3 PO) Take 5,000 Units by mouth. Once daily   diclofenac Sodium (VOLTAREN ARTHRITIS PAIN) 1 % GEL Apply 2 g topically 4 (four) times daily.   hydrOXYzine (VISTARIL) 25 MG capsule Take 1 capsule (25 mg total) by mouth every 8 (eight) hours as needed.   levothyroxine (SYNTHROID) 88 MCG tablet Take 1 tablet (88 mcg total) by mouth daily before breakfast.   losartan (COZAAR) 50 MG tablet Take 1 tablet (50 mg total) by mouth daily.   Multiple Vitamin (MULTIVITAMIN ADULT PO) Take by mouth. daily   omeprazole (PRILOSEC) 40 MG capsule Take 1 capsule (40 mg total) by mouth daily.   predniSONE (DELTASONE) 5 MG tablet Take 1 tablet (5 mg total) by mouth daily with breakfast.   triamcinolone cream (KENALOG) 0.1 % Apply 1 Application topically 2 (two) times daily.   VITAMIN E PO Take 400 Units by mouth. Once daily   No facility-administered encounter medications on file as of 01/20/2022.     History: Past Medical History:  Diagnosis Date   Sjogren's syndrome ([facility])    Spider varicose veins    No past surgical history on file.  No family history on file. Social History   Occupational History   Not on file  Tobacco Use   Smoking status: Never   Smokeless tobacco: Never  Substance and Sexual Activity   Alcohol use: Not Currently   Drug use: Never   Sexual activity: Yes    Partners: Male    Tobacco  Counseling Counseling given: Not Answered   Immunizations and Health Maintenance Immunization History  Administered Date(s) Administered   PFIZER(Purple Top)SARS-COV-2 Vaccination 05/30/2019, 04/05/2020   Health Maintenance Due  Topic Date Due   TETANUS/TDAP  Never done   COLONOSCOPY (Pts 45-68yrs Insurance coverage will need to be confirmed)  Never done   Zoster Vaccines- Shingrix (1 of 2) Never done   Pneumonia Vaccine 11+ Years old (1 - PCV) Never done   COVID-19 Vaccine (3 - Pfizer series) 05/31/2020   INFLUENZA VACCINE  Never done    Activities of Daily Living    01/20/2022   10:24 AM  In your present state of health, do you have any difficulty performing the following activities:  Hearing? 0  Vision? 0  Difficulty concentrating or making decisions? 0  Walking or climbing stairs? 0  Dressing or bathing? 0  Doing errands, shopping? 0  Preparing Food and eating ? N  Using the Toilet? N  In the past six months, have you accidently leaked urine? N  Do you have problems with loss of bowel control? N  Managing your Medications? N  Managing your Finances? N  Housekeeping or managing your Housekeeping? N    Physical Exam  ***(optional), or other factors deemed appropriate based on the beneficiary's medical and social history  and current clinical standards.  Advanced Directives:      Assessment:    This is a routine wellness examination for this patient . ***  Vision/Hearing screen Vision Screening   Right eye Left eye Both eyes  Without correction     With correction 20/20 20/20 20/20     Dietary issues and exercise activities discussed:      Goals      DIET - REDUCE CALORIE INTAKE     Would like to lose weight is working on it.        Depression Screen    01/20/2022   10:23 AM 01/20/2022   10:22 AM 01/04/2022    1:08 PM 11/25/2021   11:13 AM  PHQ 2/9 Scores  PHQ - 2 Score 0 0 0 0     Fall Risk    01/20/2022   10:23 AM  Fall Risk   Falls in the  past year? 0  Number falls in past yr: 0  Injury with Fall? 0  Risk for fall due to : No Fall Risks  Follow up Falls evaluation completed    Cognitive Function:        01/20/2022   10:25 AM  6CIT Screen  What Year? 0 points  What month? 0 points  What time? 0 points  Count back from 20 0 points  Months in reverse 0 points  Repeat phrase 0 points  Total Score 0 points    Patient Care Team: 01/22/2022, FNP as PCP - General (Family Medicine)     Plan:   ***  I have personally reviewed and noted the following in the patient's chart:   Medical and social history Use of alcohol, tobacco or illicit drugs  Current medications and supplements Functional ability and status Nutritional status Physical activity Advanced directives List of other physicians Hospitalizations, surgeries, and ER visits in previous 12 months Vitals Screenings to include cognitive, depression, and falls Referrals and appointments  In addition, I have reviewed and discussed with patient certain preventive protocols, quality metrics, and best practice recommendations. A written personalized care plan for preventive services as well as general preventive health recommendations were provided to patient.     Gilmore Laroche, Herbie Saxon 01/20/2022

## 2022-01-20 NOTE — Assessment & Plan Note (Signed)
Patient educated on CDC recommendation for the vaccine. Verbal consent was obtained from the patient, vaccine administered by nurse, no sign of adverse reactions noted at this time. Patient education on arm soreness and use of tylenol or ibuprofen for this patient  was discussed. Patient educated on the signs and symptoms of adverse effect and advise to contact the office if they occur.  

## 2022-01-20 NOTE — Patient Instructions (Addendum)
I appreciate the opportunity to provide care to you today!    Follow up:  04/07/22 for CPE   Thank you for getting your Flu Vaccine today   Referrals today- orthopedic surgery     Please continue to a heart-healthy diet and increase your physical activities. Try to exercise for at least three times a week.      It was a pleasure to see you and I look forward to continuing to work together on your health and well-being. Please do not hesitate to call the office if you need care or have questions about your care.   Have a wonderful day and week. With Gratitude, Gilmore Laroche MSN, FNP-BC

## 2022-01-20 NOTE — Progress Notes (Signed)
Subjective:    Kimberly Singh is a 66 y.o. female who presents for a Welcome to Medicare exam.   Review of Systems         Objective:    Today's Vitals   01/20/22 1016  BP: 133/82  Pulse: 82  SpO2: 98%  Weight: 146 lb 1.3 oz (66.3 kg)  Height: 5' 7.5" (1.715 m)  PainSc: 0-No pain  Body mass index is 22.54 kg/m.  Medications Outpatient Encounter Medications as of 01/20/2022  Medication Sig   Ascorbic Acid (VITAMIN C PO) Take 500 mg by mouth. Once daily   camphor-menthol (SARNA) lotion Apply 1 Application topically as needed for itching.   Cholecalciferol (VITAMIN D3 PO) Take 5,000 Units by mouth. Once daily   diclofenac Sodium (VOLTAREN ARTHRITIS PAIN) 1 % GEL Apply 2 g topically 4 (four) times daily.   hydrOXYzine (VISTARIL) 25 MG capsule Take 1 capsule (25 mg total) by mouth every 8 (eight) hours as needed.   levothyroxine (SYNTHROID) 88 MCG tablet Take 1 tablet (88 mcg total) by mouth daily before breakfast.   losartan (COZAAR) 50 MG tablet Take 1 tablet (50 mg total) by mouth daily.   Multiple Vitamin (MULTIVITAMIN ADULT PO) Take by mouth. daily   omeprazole (PRILOSEC) 40 MG capsule Take 1 capsule (40 mg total) by mouth daily.   predniSONE (DELTASONE) 5 MG tablet Take 1 tablet (5 mg total) by mouth daily with breakfast.   triamcinolone cream (KENALOG) 0.1 % Apply 1 Application topically 2 (two) times daily.   VITAMIN E PO Take 400 Units by mouth. Once daily   No facility-administered encounter medications on file as of 01/20/2022.     History: Past Medical History:  Diagnosis Date   Sjogren's syndrome ([facility])    Spider varicose veins    No past surgical history on file.  No family history on file. Social History   Occupational History   Not on file  Tobacco Use   Smoking status: Never   Smokeless tobacco: Never  Substance and Sexual Activity   Alcohol use: Not Currently   Drug use: Never   Sexual activity: Yes    Partners: Male    Tobacco  Counseling Counseling given: Not Answered   Immunizations and Health Maintenance Immunization History  Administered Date(s) Administered   PFIZER(Purple Top)SARS-COV-2 Vaccination 05/30/2019, 04/05/2020   Health Maintenance Due  Topic Date Due   TETANUS/TDAP  Never done   COLONOSCOPY (Pts 45-45yrs Insurance coverage will need to be confirmed)  Never done   Zoster Vaccines- Shingrix (1 of 2) Never done   Pneumonia Vaccine 58+ Years old (1 - PCV) Never done   COVID-19 Vaccine (3 - Pfizer series) 05/31/2020   INFLUENZA VACCINE  Never done    Activities of Daily Living    01/20/2022   10:24 AM  In your present state of health, do you have any difficulty performing the following activities:  Hearing? 0  Vision? 0  Difficulty concentrating or making decisions? 0  Walking or climbing stairs? 0  Dressing or bathing? 0  Doing errands, shopping? 0  Preparing Food and eating ? N  Using the Toilet? N  In the past six months, have you accidently leaked urine? N  Do you have problems with loss of bowel control? N  Managing your Medications? N  Managing your Finances? N  Housekeeping or managing your Housekeeping? N    Physical Exam  (optional), or other factors deemed appropriate based on the beneficiary's medical and social history  and current clinical standards.  Advanced Directives:      Assessment:    This is a routine wellness examination for this patient . yearly  Vision/Hearing screen Vision Screening   Right eye Left eye Both eyes  Without correction     With correction 20/20 20/20 20/20     Dietary issues and exercise activities discussed:      Goals      DIET - REDUCE CALORIE INTAKE     Would like to lose weight is working on it.       Depression Screen    01/20/2022   10:23 AM 01/20/2022   10:22 AM 01/04/2022    1:08 PM 11/25/2021   11:13 AM  PHQ 2/9 Scores  PHQ - 2 Score 0 0 0 0     Fall Risk    01/20/2022   10:23 AM  Fall Risk   Falls in the  past year? 0  Number falls in past yr: 0  Injury with Fall? 0  Risk for fall due to : No Fall Risks  Follow up Falls evaluation completed    Cognitive Function:        01/20/2022   10:25 AM  6CIT Screen  What Year? 0 points  What month? 0 points  What time? 0 points  Count back from 20 0 points  Months in reverse 0 points  Repeat phrase 0 points  Total Score 0 points    Patient Care Team: 01/22/2022, FNP as PCP - General (Family Medicine)     Plan:  Continue daily physical activities Continue healthy diet A referral has been placed to orthopedic surgery for chronic right shoulder pain EKG shows sinus bradycardia and pt is asymptomatic Will continue to monitior   I have personally reviewed and noted the following in the patient's chart:   Medical and social history Use of alcohol, tobacco or illicit drugs  Current medications and supplements Functional ability and status Nutritional status Physical activity Advanced directives List of other physicians Hospitalizations, surgeries, and ER visits in previous 12 months Vitals Screenings to include cognitive, depression, and falls Referrals and appointments  In addition, I have reviewed and discussed with patient certain preventive protocols, quality metrics, and best practice recommendations. A written personalized care plan for preventive services as well as general preventive health recommendations were provided to patient.     Gilmore Laroche, FNP 01/20/2022

## 2022-01-24 ENCOUNTER — Ambulatory Visit (INDEPENDENT_AMBULATORY_CARE_PROVIDER_SITE_OTHER): Payer: No Typology Code available for payment source

## 2022-01-24 ENCOUNTER — Encounter: Payer: Self-pay | Admitting: Orthopaedic Surgery

## 2022-01-24 ENCOUNTER — Ambulatory Visit (INDEPENDENT_AMBULATORY_CARE_PROVIDER_SITE_OTHER): Payer: No Typology Code available for payment source | Admitting: Orthopaedic Surgery

## 2022-01-24 VITALS — BP 133/82 | HR 96 | Ht 67.0 in | Wt 140.0 lb

## 2022-01-24 DIAGNOSIS — M542 Cervicalgia: Secondary | ICD-10-CM

## 2022-01-24 DIAGNOSIS — M25511 Pain in right shoulder: Secondary | ICD-10-CM

## 2022-01-24 MED ORDER — NAPROXEN 500 MG PO TABS: 500.0000 mg | ORAL_TABLET | Freq: Two times a day (BID) | ORAL | 5 refills | Status: DC

## 2022-01-24 NOTE — Patient Instructions (Signed)
While we are working on your approval please go ahead and call to schedule your appointment with [facility] in at least 2 weeks.    Central Scheduling [phone]  AFTER you have made your imaging appointment, please call our office back at [phone] to schedule an appointment to review your results. Your follow up appointment will need to be 3-4 days after your imaging is performed to allow us time to get the report. If you are having your imaging performed in a facility not associated with [facility], you will need to ask for a CD with your images on them.   

## 2022-01-24 NOTE — Progress Notes (Signed)
Subjective:    Patient ID: Kimberly Singh, female    DOB: [birth year], 66 y.o.   MRN: [id number]  HPI She has had neck pain and right shoulder and right arm pain.  She had a problem of raising up her arm over her head about two weeks ago.  She is much better now. She has pain radiating to the long and index fingers. She feels weaker on the right dominant side.  She has no trauma, no redness, no swelling.  She has tried ibuprofen with slight help.   Review of Systems  Constitutional:  Positive for activity change.  Musculoskeletal:  Positive for arthralgias and neck pain.  All other systems reviewed and are negative. For Review of Systems, all other systems reviewed and are negative.  The following is a summary of the past history medically, past history surgically, known current medicines, social history and family history.  This information is gathered electronically by the computer from prior information and documentation.  I review this each visit and have found including this information at this point in the chart is beneficial and informative.   Past Medical History:  Diagnosis Date   Sjogren's syndrome (HCC)    Spider varicose veins     History reviewed. No pertinent surgical history.  Current Outpatient Medications on File Prior to Visit  Medication Sig Dispense Refill   Ascorbic Acid (VITAMIN C PO) Take 500 mg by mouth. Once daily     camphor-menthol (SARNA) lotion Apply 1 Application topically as needed for itching.     Cholecalciferol (VITAMIN D3 PO) Take 5,000 Units by mouth. Once daily     diclofenac Sodium (VOLTAREN ARTHRITIS PAIN) 1 % GEL Apply 2 g topically 4 (four) times daily. 2 g 0   hydrOXYzine (VISTARIL) 25 MG capsule Take 1 capsule (25 mg total) by mouth every 8 (eight) hours as needed. 30 capsule 0   levothyroxine (SYNTHROID) 88 MCG tablet Take 1 tablet (88 mcg total) by mouth daily before breakfast. 90 tablet 1   losartan (COZAAR) 50 MG tablet Take 1 tablet (50  mg total) by mouth daily. 30 tablet 1   Multiple Vitamin (MULTIVITAMIN ADULT PO) Take by mouth. daily     omeprazole (PRILOSEC) 40 MG capsule Take 1 capsule (40 mg total) by mouth daily. 90 capsule 1   predniSONE (DELTASONE) 5 MG tablet Take 1 tablet (5 mg total) by mouth daily with breakfast. 5 tablet 0   triamcinolone cream (KENALOG) 0.1 % Apply 1 Application topically 2 (two) times daily.     VITAMIN E PO Take 400 Units by mouth. Once daily     No current facility-administered medications on file prior to visit.    Social History   Socioeconomic History   Marital status: Married    Spouse name: Not on file   Number of children: Not on file   Years of education: Not on file   Highest education level: Not on file  Occupational History   Not on file  Tobacco Use   Smoking status: Never   Smokeless tobacco: Never  Substance and Sexual Activity   Alcohol use: Not Currently   Drug use: Never   Sexual activity: Yes    Partners: Male  Other Topics Concern   Not on file  Social History Narrative   Not on file   Social Determinants of Health   Financial Resource Strain: Low Risk  (01/20/2022)   Overall Financial Resource Strain (CARDIA)    Difficulty of Paying  Living Expenses: Not hard at all  Food Insecurity: No Food Insecurity (01/20/2022)   Hunger Vital Sign    Worried About Running Out of Food in the Last Year: Never true    Ran Out of Food in the Last Year: Never true  Transportation Needs: No Transportation Needs (01/20/2022)   PRAPARE - Hydrologist (Medical): No    Lack of Transportation (Non-Medical): No  Physical Activity: Sufficiently Active (01/20/2022)   Exercise Vital Sign    Days of Exercise per Week: 4 days    Minutes of Exercise per Session: 40 min  Stress: No Stress Concern Present (01/20/2022)   [place]    Feeling of Stress : Not at all  Social Connections:  [place] (01/20/2022)   Social Connection and Isolation Panel [NHANES]    Frequency of Communication with Friends and Family: Three times a week    Frequency of Social Gatherings with Friends and Family: More than three times a week    Attends Religious Services: 1 to 4 times per year    Active Member of [employer] or Organizations: Yes    Attends Archivist Meetings: More than 4 times per year    Marital Status: Married  Human resources officer Violence: Not At Risk (01/20/2022)   Humiliation, Afraid, Rape, and Kick questionnaire    Fear of Current or Ex-Partner: No    Emotionally Abused: No    Physically Abused: No    Sexually Abused: No    History reviewed. No pertinent family history.  BP 133/82   Pulse 96   Ht 5\' 7"  (1.702 m)   Wt 140 lb (63.5 kg)   BMI 21.93 kg/m   Body mass index is 21.93 kg/m.      Objective:   Physical Exam Vitals and nursing note reviewed. Exam conducted with a chaperone present.  Constitutional:      Appearance: She is well-developed.  HENT:     Head: Normocephalic and atraumatic.  Eyes:     Conjunctiva/sclera: Conjunctivae normal.     Pupils: Pupils are equal, round, and reactive to light.  Cardiovascular:     Rate and Rhythm: Normal rate and regular rhythm.  Pulmonary:     Effort: Pulmonary effort is normal.  Abdominal:     Palpations: Abdomen is soft.  Musculoskeletal:       Arms:     Cervical back: Normal range of motion and neck supple.  Skin:    General: Skin is warm and dry.  Neurological:     Mental Status: She is alert and oriented to person, place, and time.     Cranial Nerves: No cranial nerve deficit.     Motor: No abnormal muscle tone.     Coordination: Coordination normal.     Deep Tendon Reflexes: Reflexes are normal and symmetric. Reflexes normal.  Psychiatric:        Behavior: Behavior normal.        Thought Content: Thought content normal.        Judgment: Judgment normal.   X-rays were done of the  cervical spine and shoulder, reported separately.        Assessment & Plan:   Encounter Diagnoses  Name Primary?   Acute pain of right shoulder Yes   Cervicalgia    I am concerned about her weakness on the right side and paresthesias.  I am concerned about HNP.  She has changes in her  neck by X-rays.  MRI ordered.  Increase ibuprofen to two three times a day.  Return in two weeks.  Call if any problem.  Precautions discussed.  Electronically Signed Darreld Mclean, MD 9/19/20233:09 PM

## 2022-01-30 ENCOUNTER — Encounter: Payer: Self-pay | Admitting: Family Medicine

## 2022-02-07 ENCOUNTER — Other Ambulatory Visit: Payer: Self-pay

## 2022-02-07 ENCOUNTER — Telehealth: Payer: Self-pay | Admitting: Orthopaedic Surgery

## 2022-02-07 DIAGNOSIS — I10 Essential (primary) hypertension: Secondary | ICD-10-CM

## 2022-02-07 MED ORDER — LOSARTAN POTASSIUM 50 MG PO TABS: 50.0000 mg | ORAL_TABLET | Freq: Every day | ORAL | 0 refills | Status: DC

## 2022-02-07 NOTE — Telephone Encounter (Signed)
Patient called to relay that she is feeling so much better; requested to cancel the appointment; states has already cancelled the MRI. Aware to call back if needs to reschedule.

## 2022-02-08 NOTE — Telephone Encounter (Signed)
Please follow up with Rosaria Ferries to see if there is another rheumatology that we could refer the patient to that could see her sooner than April.  Thank you

## 2022-02-14 ENCOUNTER — Ambulatory Visit (HOSPITAL_COMMUNITY): Payer: No Typology Code available for payment source

## 2022-02-22 ENCOUNTER — Ambulatory Visit: Payer: No Typology Code available for payment source | Admitting: Orthopaedic Surgery

## 2022-03-08 ENCOUNTER — Ambulatory Visit (INDEPENDENT_AMBULATORY_CARE_PROVIDER_SITE_OTHER): Payer: No Typology Code available for payment source | Admitting: Vascular Surgery

## 2022-03-08 ENCOUNTER — Encounter: Payer: Self-pay | Admitting: Vascular Surgery

## 2022-03-08 VITALS — BP 124/78 | HR 66 | Temp 97.9°F | Resp 18 | Ht 67.5 in | Wt 148.6 lb

## 2022-03-08 DIAGNOSIS — I872 Venous insufficiency (chronic) (peripheral): Secondary | ICD-10-CM | POA: Diagnosis not present

## 2022-03-08 NOTE — Progress Notes (Signed)
REASON FOR VISIT:   Follow-up of chronic venous insufficiency  MEDICAL ISSUES:   CHRONIC VENOUS INSUFFICIENCY: This patient has CEAP C4 venous disease.  She has had no further bleeding episodes.  She has been keeping the skin well lubricated and this is helped significantly.  She has deep venous reflux but really minimal superficial venous reflux.  We have again discussed the importance of leg elevation and the proper positioning for this.  I have asked her to consider wearing a knee-high compression stocking with a gradient of 20 to 30 mmHg.  I encouraged her to avoid prolonged sitting and standing.  We discussed importance of exercise specifically walking and water aerobics.  Currently, she is not a candidate for laser ablation of the right great saphenous vein as she has no reflux proximally and the vein is quite small regardless.  If her symptoms progress then certainly I be happy to see her back at any time we can reevaluate.  HPI:   Kimberly Singh is a pleasant 66 y.o. female who was seen by Dr. Lenell Antu on 11/29/2021 with chronic venous insufficiency of the right lower extremity.  Patient had spontaneous bleeding from reticular veins on the right leg.  Venous duplex scan showed significant reflux in the great saphenous vein.  She was encouraged to elevate her legs.  She was prescribed thigh-high compression stockings with a gradient of 20 to 30 mmHg.  She was encouraged to exercise.  She comes in for 29-month follow-up visit.  Since I saw her last, she states the area of concern is improved significantly.  She been keeping the area well lubricated with Vaseline.  She does describe some aching pain and heaviness in the right leg which is aggravated by sitting and standing and relieved with elevation.  She has not been wearing compression stockings as it irritates her skin.  Past Medical History:  Diagnosis Date   Sjogren's syndrome (HCC)    Spider varicose veins     History reviewed. No  pertinent family history.  SOCIAL HISTORY: Social History   Tobacco Use   Smoking status: Never   Smokeless tobacco: Never  Substance Use Topics   Alcohol use: Not Currently    No Known Allergies  Current Outpatient Medications  Medication Sig Dispense Refill   Ascorbic Acid (VITAMIN C PO) Take 500 mg by mouth. Once daily     camphor-menthol (SARNA) lotion Apply 1 Application topically as needed for itching.     Cholecalciferol (VITAMIN D3 PO) Take 5,000 Units by mouth. Once daily     diclofenac Sodium (VOLTAREN ARTHRITIS PAIN) 1 % GEL Apply 2 g topically 4 (four) times daily. 2 g 0   hydrOXYzine (VISTARIL) 25 MG capsule Take 1 capsule (25 mg total) by mouth every 8 (eight) hours as needed. 30 capsule 0   levothyroxine (SYNTHROID) 88 MCG tablet Take 1 tablet (88 mcg total) by mouth daily before breakfast. 90 tablet 1   losartan (COZAAR) 50 MG tablet Take 1 tablet (50 mg total) by mouth daily. 100 tablet 0   Multiple Vitamin (MULTIVITAMIN ADULT PO) Take by mouth. daily     naproxen (NAPROSYN) 500 MG tablet Take 1 tablet (500 mg total) by mouth 2 (two) times daily with a meal. 60 tablet 5   omeprazole (PRILOSEC) 40 MG capsule Take 1 capsule (40 mg total) by mouth daily. 90 capsule 1   triamcinolone cream (KENALOG) 0.1 % Apply 1 Application topically 2 (two) times daily.     VITAMIN  E PO Take 400 Units by mouth. Once daily     predniSONE (DELTASONE) 5 MG tablet Take 1 tablet (5 mg total) by mouth daily with breakfast. (Patient not taking: Reported on 03/08/2022) 5 tablet 0   No current facility-administered medications for this visit.    REVIEW OF SYSTEMS:  [X]  denotes positive finding, [ ]  denotes negative finding Cardiac  Comments:  Chest pain or chest pressure:    Shortness of breath upon exertion:    Short of breath when lying flat:    Irregular heart rhythm:        Vascular    Pain in calf, thigh, or hip brought on by ambulation:    Pain in feet at night that wakes you up  from your sleep:     Blood clot in your veins:    Leg swelling:         Pulmonary    Oxygen at home:    Productive cough:     Wheezing:         Neurologic    Sudden weakness in arms or legs:     Sudden numbness in arms or legs:     Sudden onset of difficulty speaking or slurred speech:    Temporary loss of vision in one eye:     Problems with dizziness:         Gastrointestinal    Blood in stool:     Vomited blood:         Genitourinary    Burning when urinating:     Blood in urine:        Psychiatric    Major depression:         Hematologic    Bleeding problems:    Problems with blood clotting too easily:        Skin    Rashes or ulcers: x       Constitutional    Fever or chills:     PHYSICAL EXAM:   Vitals:   03/08/22 1315  BP: 124/78  Pulse: 66  Resp: 18  Temp: 97.9 F (36.6 C)  TempSrc: Temporal  SpO2: 100%  Weight: 148 lb 9.6 oz (67.4 kg)  Height: 5' 7.5" (1.715 m)    GENERAL: The patient is a well-nourished female, in no acute distress. The vital signs are documented above. CARDIAC: There is a regular rate and rhythm.  VASCULAR: I do not detect carotid bruits. She has palpable posterior tibial pulses.  She has a biphasic dorsalis pedis and posterior tibial signal bilaterally. She has has multiple telangiectasias and some small varicose veins. She has has corona phlebectatica.  PULMONARY: There is good air exchange bilaterally without wheezing or rales. ABDOMEN: Soft and non-tender with normal pitched bowel sounds.  MUSCULOSKELETAL: There are no major deformities or cyanosis. NEUROLOGIC: No focal weakness or paresthesias are detected. SKIN: There are no ulcers or rashes noted. PSYCHIATRIC: The patient has a normal affect.  DATA:    VENOUS DUPLEX: I have reviewed the venous duplex scan that was done on 11/29/2021.  This was of the right lower extremity only.  There was no evidence of DVT.  There was deep venous reflux in the popliteal vein.  There  was a segment of superficial venous reflux in the right great saphenous vein from the distal thigh to the proximal calf.  The proximal saphenous vein was competent.  The results of this study are summarized on the diagram below.     Deitra [facility] [phone]

## 2022-03-09 ENCOUNTER — Other Ambulatory Visit: Payer: Self-pay | Admitting: *Deleted

## 2022-03-09 DIAGNOSIS — I10 Essential (primary) hypertension: Secondary | ICD-10-CM

## 2022-03-09 MED ORDER — LOSARTAN POTASSIUM 50 MG PO TABS: 50.0000 mg | ORAL_TABLET | Freq: Every day | ORAL | 0 refills | Status: DC

## 2022-03-14 ENCOUNTER — Other Ambulatory Visit: Payer: Self-pay | Admitting: Family Medicine

## 2022-03-14 DIAGNOSIS — I10 Essential (primary) hypertension: Secondary | ICD-10-CM

## 2022-04-07 ENCOUNTER — Encounter: Payer: Self-pay | Admitting: Family Medicine

## 2022-04-07 ENCOUNTER — Ambulatory Visit (INDEPENDENT_AMBULATORY_CARE_PROVIDER_SITE_OTHER): Payer: No Typology Code available for payment source | Admitting: Family Medicine

## 2022-04-07 VITALS — BP 152/86 | HR 80 | Resp 16 | Ht 67.5 in | Wt 147.0 lb

## 2022-04-07 DIAGNOSIS — E038 Other specified hypothyroidism: Secondary | ICD-10-CM

## 2022-04-07 DIAGNOSIS — E7849 Other hyperlipidemia: Secondary | ICD-10-CM

## 2022-04-07 DIAGNOSIS — I10 Essential (primary) hypertension: Secondary | ICD-10-CM | POA: Insufficient documentation

## 2022-04-07 DIAGNOSIS — Z23 Encounter for immunization: Secondary | ICD-10-CM | POA: Diagnosis not present

## 2022-04-07 DIAGNOSIS — R7301 Impaired fasting glucose: Secondary | ICD-10-CM | POA: Diagnosis not present

## 2022-04-07 DIAGNOSIS — R12 Heartburn: Secondary | ICD-10-CM

## 2022-04-07 DIAGNOSIS — Z0001 Encounter for general adult medical examination with abnormal findings: Secondary | ICD-10-CM

## 2022-04-07 DIAGNOSIS — E559 Vitamin D deficiency, unspecified: Secondary | ICD-10-CM | POA: Diagnosis not present

## 2022-04-07 MED ORDER — OMEPRAZOLE 40 MG PO CPDR: 40.0000 mg | DELAYED_RELEASE_CAPSULE | Freq: Every day | ORAL | 1 refills | Status: DC

## 2022-04-07 MED ORDER — LEVOTHYROXINE SODIUM 88 MCG PO TABS: 88.0000 ug | ORAL_TABLET | Freq: Every day | ORAL | 1 refills | Status: DC

## 2022-04-07 NOTE — Progress Notes (Signed)
Complete physical exam  Patient: Kimberly Singh   DOB: December 31, 1955   66 y.o. Female  MRN: [id number]  Subjective:    Chief Complaint  Patient presents with   Annual Exam    Kimberly Singh is a 66 y.o. female who presents today for a complete physical exam. She reports consuming a general and low sodium diet.  She reports going to the gym regularly and walking regularly   The eliptiaval She generally feels well. She reports sleeping well. She does not have additional problems to discuss today.      Most recent fall risk assessment:    04/07/2022   10:52 AM  Fall Risk   Falls in the past year? 0  Number falls in past yr: 0  Injury with Fall? 0     Most recent depression screenings:    04/07/2022   10:52 AM 01/20/2022   10:23 AM  PHQ 2/9 Scores  PHQ - 2 Score 0 0    Dental: No current dental problems and No regular dental care   Patient Active Problem List   Diagnosis Date Noted   Primary hypertension 04/07/2022   Encounter for annual general medical examination with abnormal findings in adult 04/07/2022   Need for immunization against influenza 01/20/2022   Right shoulder pain 01/04/2022   Herpes genitalia 11/25/2021   Rash of body 11/17/2021   Sjogren's syndrome ([place]) 10/06/2021   Spider varicose veins 10/06/2021   Past Medical History:  Diagnosis Date   Sjogren's syndrome ([place])    Spider varicose veins    No past surgical history on file. Social History   Tobacco Use   Smoking status: Never   Smokeless tobacco: Never  Substance Use Topics   Alcohol use: Not Currently   Drug use: Never   Social History   Socioeconomic History   Marital status: Married    Spouse name: Not on file   Number of children: Not on file   Years of education: Not on file   Highest education level: Not on file  Occupational History   Not on file  Tobacco Use   Smoking status: Never   Smokeless tobacco: Never  Substance and Sexual Activity   Alcohol use: Not Currently   Drug  use: Never   Sexual activity: Yes    Partners: Male  Other Topics Concern   Not on file  Social History Narrative   Not on file   Social Determinants of Health   Financial Resource Strain: Low Risk  (01/20/2022)   Overall Financial Resource Strain (CARDIA)    Difficulty of Paying Living Expenses: Not hard at all  Food Insecurity: No Food Insecurity (01/20/2022)   Hunger Vital Sign    Worried About Running Out of Food in the Last Year: Never true    [place] in the Last Year: Never true  Transportation Needs: No Transportation Needs (01/20/2022)   PRAPARE - Hydrologist (Medical): No    Lack of Transportation (Non-Medical): No  Physical Activity: Sufficiently Active (01/20/2022)   Exercise Vital Sign    Days of Exercise per Week: 4 days    Minutes of Exercise per Session: 40 min  Stress: No Stress Concern Present (01/20/2022)   [place]    Feeling of Stress : Not at all  Social Connections: [place] (01/20/2022)   Social Connection and Isolation Panel [NHANES]    Frequency of Communication with Friends  and Family: Three times a week    Frequency of Social Gatherings with Friends and Family: More than three times a week    Attends Religious Services: 1 to 4 times per year    Active Member of [employer] or Organizations: Yes    Attends Archivist Meetings: More than 4 times per year    Marital Status: Married  Human resources officer Violence: Not At Risk (01/20/2022)   Humiliation, Afraid, Rape, and Kick questionnaire    Fear of Current or Ex-Partner: No    Emotionally Abused: No    Physically Abused: No    Sexually Abused: No   No family status information on file.   No family history on file. No Known Allergies    Patient Care Team: Alvira Monday, FNP as PCP - General (Family Medicine)   Outpatient Medications Prior to Visit  Medication Sig   Ascorbic Acid  (VITAMIN C PO) Take 500 mg by mouth. Once daily   camphor-menthol (SARNA) lotion Apply 1 Application topically as needed for itching.   Cholecalciferol (VITAMIN D3 PO) Take 5,000 Units by mouth. Once daily   diclofenac Sodium (VOLTAREN ARTHRITIS PAIN) 1 % GEL Apply 2 g topically 4 (four) times daily.   hydrOXYzine (VISTARIL) 25 MG capsule Take 1 capsule (25 mg total) by mouth every 8 (eight) hours as needed.   losartan (COZAAR) 50 MG tablet Take 1 tablet (50 mg total) by mouth daily.   Multiple Vitamin (MULTIVITAMIN ADULT PO) Take by mouth. daily   naproxen (NAPROSYN) 500 MG tablet Take 1 tablet (500 mg total) by mouth 2 (two) times daily with a meal.   triamcinolone cream (KENALOG) 0.1 % Apply 1 Application topically 2 (two) times daily.   VITAMIN E PO Take 400 Units by mouth. Once daily   [DISCONTINUED] levothyroxine (SYNTHROID) 88 MCG tablet Take 1 tablet (88 mcg total) by mouth daily before breakfast.   [DISCONTINUED] omeprazole (PRILOSEC) 40 MG capsule Take 1 capsule (40 mg total) by mouth daily.   [DISCONTINUED] predniSONE (DELTASONE) 5 MG tablet Take 1 tablet (5 mg total) by mouth daily with breakfast. (Patient not taking: Reported on 03/08/2022)   No facility-administered medications prior to visit.    Review of Systems  Constitutional:  Negative for diaphoresis and malaise/fatigue.  HENT:  Negative for congestion.   Eyes:  Negative for pain and discharge.  Respiratory:  Negative for cough and shortness of breath.   Cardiovascular:  Negative for chest pain.  Gastrointestinal:  Negative for blood in stool.  Genitourinary:  Negative for frequency and hematuria.  Musculoskeletal:  Negative for joint pain.  Neurological:  Negative for dizziness.  Endo/Heme/Allergies:  Negative for polydipsia.  Psychiatric/Behavioral:  Negative for memory loss.        Objective:    BP (!) 152/86   Pulse 80   Resp 16   Ht 5' 7.5" (1.715 m)   Wt 147 lb (66.7 kg)   SpO2 98%   BMI 22.68 kg/m   BP Readings from Last 3 Encounters:  04/07/22 (!) 152/86  03/08/22 124/78  01/24/22 133/82   Wt Readings from Last 3 Encounters:  04/07/22 147 lb (66.7 kg)  03/08/22 148 lb 9.6 oz (67.4 kg)  01/24/22 140 lb (63.5 kg)      Physical Exam HENT:     Head: Normocephalic.     Right Ear: External ear normal.     Left Ear: External ear normal.     Nose: No congestion.     Mouth/Throat:  Mouth: Mucous membranes are moist.  Eyes:     Extraocular Movements: Extraocular movements intact.     Pupils: Pupils are equal, round, and reactive to light.  Cardiovascular:     Rate and Rhythm: Normal rate and regular rhythm.     Pulses: Normal pulses.     Heart sounds: Normal heart sounds.  Abdominal:     Palpations: Abdomen is soft.     Tenderness: There is no right CVA tenderness or left CVA tenderness.  Musculoskeletal:     Cervical back: No rigidity.     Right lower leg: No edema.     Left lower leg: No edema.  Skin:    Findings: No bruising or erythema.  Neurological:     Mental Status: She is alert and oriented to person, place, and time.     GCS: GCS eye subscore is 4. GCS verbal subscore is 5. GCS motor subscore is 6.     Cranial Nerves: No cranial nerve deficit.     Motor: No abnormal muscle tone.     Coordination: Coordination normal. Finger-Nose-Finger Test normal.     Gait: Gait normal.     No results found for any visits on 04/07/22. Last CBC Lab Results  Component Value Date   WBC 4.1 01/05/2022   HGB 12.5 01/05/2022   HCT 37.4 01/05/2022   MCV 92 01/05/2022   MCH 30.8 01/05/2022   RDW 13.0 01/05/2022   PLT 289 81/05/7508   Last metabolic panel Lab Results  Component Value Date   GLUCOSE 84 01/05/2022   NA 137 01/05/2022   K 4.9 01/05/2022   CL 102 01/05/2022   CO2 24 01/05/2022   BUN 20 01/05/2022   CREATININE 0.95 01/05/2022   EGFR 66 01/05/2022   CALCIUM 8.9 01/05/2022   PROT 7.2 01/05/2022   ALBUMIN 4.3 01/05/2022   LABGLOB 2.9 01/05/2022    AGRATIO 1.5 01/05/2022   BILITOT 0.3 01/05/2022   ALKPHOS 120 01/05/2022   AST 27 01/05/2022   ALT 19 01/05/2022   Last lipids Lab Results  Component Value Date   CHOL 176 01/05/2022   HDL 57 01/05/2022   LDLCALC 107 (H) 01/05/2022   TRIG 63 01/05/2022   CHOLHDL 3.1 01/05/2022   Last hemoglobin A1c Lab Results  Component Value Date   HGBA1C 5.9 (H) 01/05/2022   Last thyroid functions Lab Results  Component Value Date   TSH 4.500 01/05/2022   Last vitamin D Lab Results  Component Value Date   VD25OH 50.1 01/05/2022   Last vitamin B12 and Folate No results found for: "VITAMINB12", "FOLATE"      Assessment & Plan:    Routine Health Maintenance and Physical Exam  Immunization History  Administered Date(s) Administered   Fluad Quad(high Dose 65+) 01/20/2022   PFIZER(Purple Top)SARS-COV-2 Vaccination 05/30/2019, 04/05/2020    Health Maintenance  Topic Date Due   DTaP/Tdap/Td (1 - Tdap) Never done   COLONOSCOPY (Pts 45-73yr Insurance coverage will need to be confirmed)  Never done   Zoster Vaccines- Shingrix (1 of 2) Never done   Pneumonia Vaccine 67 Years old (1 - PCV) Never done   COVID-19 Vaccine (3 - 2023-24 season) 01/06/2022   MAMMOGRAM  09/01/2022   Medicare Annual Wellness (AWV)  01/21/2023   INFLUENZA VACCINE  Completed   DEXA SCAN  Completed   Hepatitis C Screening  Completed   HPV VACCINES  Aged Out    Discussed health benefits of physical activity, and encouraged her to engage  in regular exercise appropriate for her age and condition.  Encounter for annual general medical examination with abnormal findings in adult Assessment & Plan: Physical exam as documented Counseling is done on healthy lifestyle involving commitment to 150 minutes of exercise per week,  Discussed heart-healthy diet  She reports having the Cologuard kit at home and will complete the kit as soon as possible to be screened for colon cancer She will receive her shingles and  pneumonia vaccine today        Primary hypertension Assessment & Plan: Uncontrolled She takes losartan 50 mg daily and reports compliance with treatment regimen She notes low-sodium diet with increased physical activities Encouraged patient to check her ambulatory blood pressure daily and bring her readings with her to her follow-up appointment Blood pressure goal <140/90 Encouraged to continue taking losartan 50 mg daily We will follow up on BP in 2 weeks BP Readings from Last 3 Encounters:  04/07/22 (!) 152/86  03/08/22 124/78  01/24/22 133/82      Other specified hypothyroidism -     Levothyroxine Sodium; Take 1 tablet (88 mcg total) by mouth daily before breakfast.  Dispense: 90 tablet; Refill: 1 -     TSH + free T4  Heartburn -     Omeprazole; Take 1 capsule (40 mg total) by mouth daily.  Dispense: 90 capsule; Refill: 1  IFG (impaired fasting glucose) -     Hemoglobin A1c  Vitamin D deficiency -     VITAMIN D 25 Hydroxy (Vit-D Deficiency, Fractures)  Other hyperlipidemia -     Lipid panel -     CMP14+EGFR -     CBC with Differential/Platelet    Return in about 2 weeks (around 04/21/2022) for BP.     Alvira Monday, FNP

## 2022-04-07 NOTE — Addendum Note (Signed)
Addended by: Abner Greenspan on: 04/07/2022 11:57 AM   Modules accepted: Orders

## 2022-04-07 NOTE — Assessment & Plan Note (Signed)
Physical exam as documented Counseling is done on healthy lifestyle involving commitment to 150 minutes of exercise per week,  Discussed heart-healthy diet  She reports having the Cologuard kit at home and will complete the kit as soon as possible to be screened for colon cancer She will receive her shingles and pneumonia vaccine today

## 2022-04-07 NOTE — Patient Instructions (Addendum)
I appreciate the opportunity to provide care to you today!    Follow up:  2 weeks for BP  Labs: please stop by the lab during the week to get your blood drawn (CBC, CMP, TSH, Lipid profile, HgA1c, Vit D)  Please check your BP daily and bring the reading with you to your next appt Blood pressure goal <140/90  Please pick up your refills at your pharmacy  Thank you for getting your shingles and pneumonia vaccination today   Please continue to a heart-healthy diet and increase your physical activities. Try to exercise for at least three times a week.      It was a pleasure to see you and I look forward to continuing to work together on your health and well-being. Please do not hesitate to call the office if you need care or have questions about your care.   Have a wonderful day and week. With Gratitude, Gilmore Laroche MSN, FNP-BC'

## 2022-04-07 NOTE — Assessment & Plan Note (Signed)
Uncontrolled She takes losartan 50 mg daily and reports compliance with treatment regimen She notes low-sodium diet with increased physical activities Encouraged patient to check her ambulatory blood pressure daily and bring her readings with her to her follow-up appointment Blood pressure goal <140/90 Encouraged to continue taking losartan 50 mg daily We will follow up on BP in 2 weeks BP Readings from Last 3 Encounters:  04/07/22 (!) 152/86  03/08/22 124/78  01/24/22 133/82

## 2022-04-25 ENCOUNTER — Ambulatory Visit: Payer: No Typology Code available for payment source | Admitting: Family Medicine

## 2022-05-16 ENCOUNTER — Ambulatory Visit: Payer: No Typology Code available for payment source | Admitting: Family Medicine

## 2022-05-19 DIAGNOSIS — E7849 Other hyperlipidemia: Secondary | ICD-10-CM | POA: Diagnosis not present

## 2022-05-19 DIAGNOSIS — E038 Other specified hypothyroidism: Secondary | ICD-10-CM | POA: Diagnosis not present

## 2022-05-19 DIAGNOSIS — R7301 Impaired fasting glucose: Secondary | ICD-10-CM | POA: Diagnosis not present

## 2022-05-19 DIAGNOSIS — E559 Vitamin D deficiency, unspecified: Secondary | ICD-10-CM | POA: Diagnosis not present

## 2022-05-20 LAB — HEMOGLOBIN A1C
Est. average glucose Bld gHb Est-mCnc: 114 mg/dL
Hgb A1c MFr Bld: 5.6 % (ref 4.8–5.6)

## 2022-05-20 LAB — CBC WITH DIFFERENTIAL/PLATELET
Basophils Absolute: 0 10*3/uL (ref 0.0–0.2)
Basos: 1 %
EOS (ABSOLUTE): 0.2 10*3/uL (ref 0.0–0.4)
Eos: 3 %
Hematocrit: 35.4 % (ref 34.0–46.6)
Hemoglobin: 12 g/dL (ref 11.1–15.9)
Immature Grans (Abs): 0 10*3/uL (ref 0.0–0.1)
Immature Granulocytes: 0 %
Lymphocytes Absolute: 1.6 10*3/uL (ref 0.7–3.1)
Lymphs: 32 %
MCH: 30.5 pg (ref 26.6–33.0)
MCHC: 33.9 g/dL (ref 31.5–35.7)
MCV: 90 fL (ref 79–97)
Monocytes Absolute: 0.4 10*3/uL (ref 0.1–0.9)
Monocytes: 9 %
Neutrophils Absolute: 2.8 10*3/uL (ref 1.4–7.0)
Neutrophils: 55 %
Platelets: 296 10*3/uL (ref 150–450)
RBC: 3.94 x10E6/uL (ref 3.77–5.28)
RDW: 11.5 % — ABNORMAL LOW (ref 11.7–15.4)
WBC: 5.1 10*3/uL (ref 3.4–10.8)

## 2022-05-20 LAB — LIPID PANEL
Chol/HDL Ratio: 2.8 ratio (ref 0.0–4.4)
Cholesterol, Total: 166 mg/dL (ref 100–199)
HDL: 60 mg/dL (ref >39)
LDL Chol Calc (NIH): 92 mg/dL (ref 0–99)
Triglycerides: 74 mg/dL (ref 0–149)
VLDL Cholesterol Cal: 14 mg/dL (ref 5–40)

## 2022-05-20 LAB — CMP14+EGFR
ALT: 20 IU/L (ref 0–32)
AST: 28 IU/L (ref 0–40)
Albumin/Globulin Ratio: 1.5 (ref 1.2–2.2)
Albumin: 4.1 g/dL (ref 3.9–4.9)
Alkaline Phosphatase: 121 IU/L (ref 44–121)
BUN/Creatinine Ratio: 22 (ref 12–28)
BUN: 18 mg/dL (ref 8–27)
Bilirubin Total: 0.3 mg/dL (ref 0.0–1.2)
CO2: 22 mmol/L (ref 20–29)
Calcium: 8.5 mg/dL — ABNORMAL LOW (ref 8.7–10.3)
Chloride: 104 mmol/L (ref 96–106)
Creatinine, Ser: 0.82 mg/dL (ref 0.57–1.00)
Globulin, Total: 2.7 g/dL (ref 1.5–4.5)
Glucose: 84 mg/dL (ref 70–99)
Potassium: 4.5 mmol/L (ref 3.5–5.2)
Sodium: 141 mmol/L (ref 134–144)
Total Protein: 6.8 g/dL (ref 6.0–8.5)
eGFR: 78 mL/min/{1.73_m2} (ref >59)

## 2022-05-20 LAB — VITAMIN D 25 HYDROXY (VIT D DEFICIENCY, FRACTURES): Vit D, 25-Hydroxy: 60.9 ng/mL (ref 30.0–100.0)

## 2022-05-20 LAB — TSH+FREE T4
Free T4: 1.65 ng/dL (ref 0.82–1.77)
TSH: 5.07 u[IU]/mL — ABNORMAL HIGH (ref 0.450–4.500)

## 2022-05-24 ENCOUNTER — Other Ambulatory Visit: Payer: Self-pay | Admitting: Family Medicine

## 2022-05-24 DIAGNOSIS — E038 Other specified hypothyroidism: Secondary | ICD-10-CM

## 2022-05-29 ENCOUNTER — Encounter: Payer: Self-pay | Admitting: Family Medicine

## 2022-05-29 ENCOUNTER — Ambulatory Visit (INDEPENDENT_AMBULATORY_CARE_PROVIDER_SITE_OTHER): Payer: No Typology Code available for payment source | Admitting: Family Medicine

## 2022-05-29 VITALS — BP 138/86 | HR 70 | Ht 67.5 in | Wt 147.1 lb

## 2022-05-29 DIAGNOSIS — I10 Essential (primary) hypertension: Secondary | ICD-10-CM

## 2022-05-29 DIAGNOSIS — G8929 Other chronic pain: Secondary | ICD-10-CM

## 2022-05-29 DIAGNOSIS — L03115 Cellulitis of right lower limb: Secondary | ICD-10-CM

## 2022-05-29 DIAGNOSIS — Z23 Encounter for immunization: Secondary | ICD-10-CM | POA: Diagnosis not present

## 2022-05-29 DIAGNOSIS — I872 Venous insufficiency (chronic) (peripheral): Secondary | ICD-10-CM | POA: Insufficient documentation

## 2022-05-29 DIAGNOSIS — I868 Varicose veins of other specified sites: Secondary | ICD-10-CM | POA: Diagnosis not present

## 2022-05-29 DIAGNOSIS — M25511 Pain in right shoulder: Secondary | ICD-10-CM | POA: Diagnosis not present

## 2022-05-29 DIAGNOSIS — Z1211 Encounter for screening for malignant neoplasm of colon: Secondary | ICD-10-CM

## 2022-05-29 MED ORDER — CEPHALEXIN 500 MG PO CAPS: 500.0000 mg | ORAL_CAPSULE | Freq: Four times a day (QID) | ORAL | 0 refills | Status: AC

## 2022-05-29 MED ORDER — HYDROCHLOROTHIAZIDE 12.5 MG PO TABS: 12.5000 mg | ORAL_TABLET | Freq: Every day | ORAL | 0 refills | Status: DC | PRN

## 2022-05-29 NOTE — Assessment & Plan Note (Addendum)
She followed-up with orthopedics on 01/24/2022 for right shoulder pain Dr. Wandra Arthurs was concerned for herniated disc, and patient was encouraged to get an MRI She reports not getting imaging study due to cost She complains of right shoulder pain when lying supine Pain radiates to her wrist She complains of occasional shooting pain in her right arm None reported today No pain with palpation of the right shoulder and arm Range of motion of the right shoulder is intact She complains of pain when lying on the affected side, noting a constant achy sensation in the affected arm No recent trauma or injury to the affected sites No swelling, or redness reported She was following up with a chiropractor, and reported relief of symptoms, however she is no longer following up with a chiropractor Encouraged the patient to follow-up with physical therapy She reports taking naproxen 500 mg at bedtime only Encouraged to continue taking naproxen 500 mg twice daily Patient declines steroid taper at the moment Encouraged to follow-up with orthopedics for worsening of symptoms

## 2022-05-29 NOTE — Patient Instructions (Addendum)
I appreciate the opportunity to provide care to you today!    Follow up:  3 months  Labs: next visit  I recommend taking naproxen 500 mg twice daily for pain in your shoulder and neck A referral has been placed to physical therapy I will be treating you for cellulitis of the right lower extremity with Keflex to take 4 times daily for 7 days Will also provide you with antibiotic ointment to apply on the lower extremity to prevent infection I recommend elevation of your lower extremities to reduce swelling A prescription for hydrochlorothiazide 12.5 mg to take daily as needed to relieve swelling of your lower extremities    Referrals today- Physical therapy   Please continue to a heart-healthy diet and increase your physical activities. Try to exercise for 72mins at least five times a week.      It was a pleasure to see you and I look forward to continuing to work together on your health and well-being. Please do not hesitate to call the office if you need care or have questions about your care.   Have a wonderful day and week. With Gratitude, Alvira Monday MSN, FNP-BC'

## 2022-05-29 NOTE — Assessment & Plan Note (Signed)
History of chronic venous insufficiency of the right lower extremity with spontaneous bleeding from reticular veins on the right leg Venous duplex scan showed significant reflux in the great saphenous vein She complains of increased swelling on her right lower extremity Physical examination notably of cellulitis of the right lower extremity Will treat with Keflex 500 mg to take 4 times daily for 7 days She was encouraged to elevate her legs Hydrochlorothiazide 12.5 mg ordered for prophy edema of the right lower extremity

## 2022-05-29 NOTE — Progress Notes (Signed)
Established Patient Office Visit  Subjective:  Patient ID: Kimberly Singh, female    DOB: 12-29-1955  Age: 67 y.o. MRN: [id number]  CC:  Chief Complaint  Patient presents with   Follow-up    3 month f/u. Pt reports turning down the MRI for her right shoulder due to financial situation, still having right shoulder pain and neck pain. Pt    Leg Pain    Pt reports varicose vein problem has swelling on her right lower leg, since last office visit. Wants advice on what to put on it.     HPI Kimberly Singh is a 66 y.o. female with past medical history of chronic right shoulder pain, chronic venous insufficiency, and primary hypertension presents for f/u of  chronic medical conditions. For the details of today's visit, please refer to the assessment and plan.     Past Medical History:  Diagnosis Date   Sjogren's syndrome (HCC)    Spider varicose veins     History reviewed. No pertinent surgical history.  History reviewed. No pertinent family history.  Social History   Socioeconomic History   Marital status: Married    Spouse name: Not on file   Number of children: Not on file   Years of education: Not on file   Highest education level: Not on file  Occupational History   Not on file  Tobacco Use   Smoking status: Never   Smokeless tobacco: Never  Substance and Sexual Activity   Alcohol use: Not Currently   Drug use: Never   Sexual activity: Yes    Partners: Male  Other Topics Concern   Not on file  Social History Narrative   Not on file   Social Determinants of Health   Financial Resource Strain: Low Risk  (01/20/2022)   Overall Financial Resource Strain (CARDIA)    Difficulty of Paying Living Expenses: Not hard at all  Food Insecurity: No Food Insecurity (01/20/2022)   Hunger Vital Sign    Worried About Running Out of Food in the Last Year: Never true    Ran Out of Food in the Last Year: Never true  Transportation Needs: No Transportation Needs (01/20/2022)   PRAPARE  - Administrator, Civil Service (Medical): No    Lack of Transportation (Non-Medical): No  Physical Activity: Sufficiently Active (01/20/2022)   Exercise Vital Sign    Days of Exercise per Week: 4 days    Minutes of Exercise per Session: 40 min  Stress: No Stress Concern Present (01/20/2022)   [employer] of Occupational Health - Occupational Stress Questionnaire    Feeling of Stress : Not at all  Social Connections: Socially Integrated (01/20/2022)   Social Connection and Isolation Panel [NHANES]    Frequency of Communication with Friends and Family: Three times a week    Frequency of Social Gatherings with Friends and Family: More than three times a week    Attends Religious Services: 1 to 4 times per year    Active Member of [employer] or Organizations: Yes    Attends Engineer, structural: More than 4 times per year    Marital Status: Married  Catering manager Violence: Not At Risk (01/20/2022)   Humiliation, Afraid, Rape, and Kick questionnaire    Fear of Current or Ex-Partner: No    Emotionally Abused: No    Physically Abused: No    Sexually Abused: No    Outpatient Medications Prior to Visit  Medication Sig Dispense Refill   Ascorbic  Acid (VITAMIN C PO) Take 500 mg by mouth. Once daily     camphor-menthol (SARNA) lotion Apply 1 Application topically as needed for itching.     Cholecalciferol (VITAMIN D3 PO) Take 5,000 Units by mouth. Once daily     diclofenac Sodium (VOLTAREN ARTHRITIS PAIN) 1 % GEL Apply 2 g topically 4 (four) times daily. 2 g 0   hydrOXYzine (VISTARIL) 25 MG capsule Take 1 capsule (25 mg total) by mouth every 8 (eight) hours as needed. 30 capsule 0   levothyroxine (SYNTHROID) 88 MCG tablet Take 1 tablet (88 mcg total) by mouth daily before breakfast. 90 tablet 1   losartan (COZAAR) 50 MG tablet Take 1 tablet (50 mg total) by mouth daily. 100 tablet 0   Multiple Vitamin (MULTIVITAMIN ADULT PO) Take by mouth. daily     naproxen (NAPROSYN)  500 MG tablet Take 1 tablet (500 mg total) by mouth 2 (two) times daily with a meal. 60 tablet 5   omeprazole (PRILOSEC) 40 MG capsule Take 1 capsule (40 mg total) by mouth daily. 90 capsule 1   triamcinolone cream (KENALOG) 0.1 % Apply 1 Application topically 2 (two) times daily.     VITAMIN E PO Take 400 Units by mouth. Once daily     No facility-administered medications prior to visit.    No Known Allergies  ROS Review of Systems  Constitutional:  Negative for chills and fever.  Eyes:  Negative for visual disturbance.  Respiratory:  Negative for chest tightness and shortness of breath.   Musculoskeletal:  Positive for arthralgias.       Complains of right shoulder pain and right leg pain  Neurological:  Negative for dizziness and headaches.      Objective:    Physical Exam Musculoskeletal:     Right shoulder: No effusion, laceration or tenderness. Normal range of motion. Normal pulse.     Left shoulder: No effusion, laceration or tenderness. Normal range of motion. Normal pulse.     Right lower leg: Tenderness present. 2+ Edema present.     Left lower leg: No deformity, lacerations or tenderness.     Comments: Acute red-colored skin that is poorly demarcated with advancing margins Skin is warm to touch No drainage noted     BP 138/86   Pulse 70   Ht 5' 7.5" (1.715 m)   Wt 147 lb 1.9 oz (66.7 kg)   SpO2 100%   BMI 22.70 kg/m  Wt Readings from Last 3 Encounters:  05/29/22 147 lb 1.9 oz (66.7 kg)  04/07/22 147 lb (66.7 kg)  03/08/22 148 lb 9.6 oz (67.4 kg)    Lab Results  Component Value Date   TSH 5.070 (H) 05/19/2022   Lab Results  Component Value Date   WBC 5.1 05/19/2022   HGB 12.0 05/19/2022   HCT 35.4 05/19/2022   MCV 90 05/19/2022   PLT 296 05/19/2022   Lab Results  Component Value Date   NA 141 05/19/2022   K 4.5 05/19/2022   CO2 22 05/19/2022   GLUCOSE 84 05/19/2022   BUN 18 05/19/2022   CREATININE 0.82 05/19/2022   BILITOT 0.3 05/19/2022    ALKPHOS 121 05/19/2022   AST 28 05/19/2022   ALT 20 05/19/2022   PROT 6.8 05/19/2022   ALBUMIN 4.1 05/19/2022   CALCIUM 8.5 (L) 05/19/2022   EGFR 78 05/19/2022   Lab Results  Component Value Date   CHOL 166 05/19/2022   Lab Results  Component Value Date  HDL 60 05/19/2022   Lab Results  Component Value Date   LDLCALC 92 05/19/2022   Lab Results  Component Value Date   TRIG 74 05/19/2022   Lab Results  Component Value Date   CHOLHDL 2.8 05/19/2022   Lab Results  Component Value Date   HGBA1C 5.6 05/19/2022      Assessment & Plan:  Cellulitis of right lower extremity Assessment & Plan: History of chronic venous insufficiency of the right lower extremity with spontaneous bleeding from reticular veins on the right leg Venous duplex scan showed significant reflux in the great saphenous vein She complains of increased swelling on her right lower extremity Physical examination notably of cellulitis of the right lower extremity Will treat with Keflex 500 mg to take 4 times daily for 7 days She was encouraged to elevate her legs Hydrochlorothiazide 12.5 mg ordered for prophy edema of the right lower extremity  Orders: -     Cephalexin; Take 1 capsule (500 mg total) by mouth 4 (four) times daily for 7 days.  Dispense: 28 capsule; Refill: 0 -     hydroCHLOROthiazide; Take 1 tablet (12.5 mg total) by mouth daily as needed.  Dispense: 20 tablet; Refill: 0  Chronic right shoulder pain Assessment & Plan: She followed-up with orthopedics on 01/24/2022 for right shoulder pain Dr. Wandra Arthurs was concerned for herniated disc, and patient was encouraged to get an MRI She reports not getting imaging study due to cost She complains of right shoulder pain when lying supine Pain radiates to her wrist She complains of occasional shooting pain in her right arm None reported today No pain with palpation of the right shoulder and arm Range of motion of the right shoulder is  intact She complains of pain when lying on the affected side, noting a constant achy sensation in the affected arm No recent trauma or injury to the affected sites No swelling, or redness reported She was following up with a chiropractor, and reported relief of symptoms, however she is no longer following up with a chiropractor Encouraged the patient to follow-up with physical therapy She reports taking naproxen 500 mg at bedtime only Encouraged to continue taking naproxen 500 mg twice daily Patient declines steroid taper at the moment Encouraged to follow-up with orthopedics for worsening of symptoms  Orders: -     Ambulatory referral to Physical Therapy  Spider varicose veins  Colon cancer screening -     Fecal occult blood, imunochemical  Immunization due -     Varicella-zoster vaccine IM  Primary hypertension    Follow-up: Return in about 3 months (around 08/28/2022).   Alvira Monday, FNP

## 2022-05-30 ENCOUNTER — Other Ambulatory Visit: Payer: Self-pay | Admitting: Family Medicine

## 2022-05-30 DIAGNOSIS — L03115 Cellulitis of right lower limb: Secondary | ICD-10-CM

## 2022-05-30 MED ORDER — MUPIROCIN 2 % EX OINT: 1.0000 | TOPICAL_OINTMENT | Freq: Two times a day (BID) | CUTANEOUS | 0 refills | Status: DC

## 2022-06-01 ENCOUNTER — Encounter (HOSPITAL_COMMUNITY): Payer: Self-pay | Admitting: Occupational Therapy

## 2022-06-01 ENCOUNTER — Ambulatory Visit (HOSPITAL_COMMUNITY): Payer: No Typology Code available for payment source | Attending: Family Medicine | Admitting: Occupational Therapy

## 2022-06-01 DIAGNOSIS — R29898 Other symptoms and signs involving the musculoskeletal system: Secondary | ICD-10-CM | POA: Insufficient documentation

## 2022-06-01 DIAGNOSIS — G8929 Other chronic pain: Secondary | ICD-10-CM | POA: Insufficient documentation

## 2022-06-01 DIAGNOSIS — M25511 Pain in right shoulder: Secondary | ICD-10-CM | POA: Diagnosis not present

## 2022-06-01 NOTE — Patient Instructions (Signed)

## 2022-06-01 NOTE — Therapy (Signed)
OUTPATIENT OCCUPATIONAL THERAPY ORTHO EVALUATION  Patient Name: Kimberly Singh MRN: [id number] DOB:06-06-1955, 67 y.o., female Today's Date: 06/01/2022  PCP: Kimberly Monday, FNP REFERRING PROVIDER: Alvira Monday, FNP  END OF SESSION:  OT End of Session - 06/01/22 1012     Visit Number 1    Number of Visits 1    Date for OT Re-Evaluation 06/02/22    Authorization Type [employer]    OT Start Time [phone]    OT Stop Time 1010    OT Time Calculation (min) 29 min    Activity Tolerance Patient tolerated treatment well    Behavior During Therapy WFL for tasks assessed/performed             Past Medical History:  Diagnosis Date   Sjogren's syndrome (Kimberly Singh)    Spider varicose veins    History reviewed. No pertinent surgical history. Patient Active Problem List   Diagnosis Date Noted   Cellulitis of right lower extremity 05/29/2022   Primary hypertension 04/07/2022   Encounter for annual general medical examination with abnormal findings in adult 04/07/2022   Need for immunization against influenza 01/20/2022   Chronic right shoulder pain 01/04/2022   Herpes genitalia 11/25/2021   Rash of body 11/17/2021   Sjogren's syndrome (Kimberly Singh) 10/06/2021   Spider varicose veins 10/06/2021    ONSET DATE: 03/2022  REFERRING DIAG: right shoulder pain  THERAPY DIAG:  Acute pain of right shoulder  Other symptoms and signs involving the musculoskeletal system  Rationale for Evaluation and Treatment: Rehabilitation  SUBJECTIVE:   SUBJECTIVE STATEMENT: S: "I didn't want to do the MRI, I went to PT once but their was in issue with my insurance."  Pt accompanied by: self  PERTINENT HISTORY: Pt reports they moved here in May and did some heavy renovations in the bathroom with she believes precipitated the pain. The pain began worsening in 11/23. Pt has had an x-ray, declined an MRI because of cost.   PRECAUTIONS: None  WEIGHT BEARING RESTRICTIONS: No  PAIN:  Are you having pain?  No  FALLS: Has patient fallen in last 6 months? No  LIVING ENVIRONMENT: Lives with: lives with their spouse Lives in: House/apartment Stairs: Yes: Internal: 15 steps; on right going up, on left going up, and can reach both and External: 13 steps; on right going up, on left going up, and can reach both Has following equipment at home: None  PLOF: Independent  PATIENT GOALS: To have less pain at night  NEXT MD VISIT: 08/28/22  OBJECTIVE:   HAND DOMINANCE: Right  ADLs: Overall ADLs: Pt reports she has a lot of pain at night when trying to rest, she is up and down all night. She sleeps in the bed. Sometimes pt has random pains when she is sitting still.    FUNCTIONAL OUTCOME MEASURES: FOTO: 74/100  UPPER EXTREMITY ROM:     Active ROM Right eval  Shoulder flexion 162  Shoulder abduction 165  Shoulder internal rotation 90  Shoulder external rotation 53  (Blank rows = not tested)   UPPER EXTREMITY MMT:     MMT Right eval  Shoulder flexion 5/5  Shoulder abduction 5/5  Shoulder internal rotation 5/5  Shoulder external rotation 5/5  (Blank rows = not tested)   COGNITION: Overall cognitive status: Within functional limits for tasks assessed  OBSERVATIONS: Min fascial restrictions noted in right upper arm, anterior shoulder, trapezius, and scapular regions   TODAY'S TREATMENT:  DATE: N/A-eval only     PATIENT EDUCATION: Education details: A/ROM Person educated: Patient Education method: Consulting civil engineer, Media planner, and Handouts Education comprehension: verbalized understanding and returned demonstration  HOME EXERCISE PROGRAM: Eval: A/ROM  GOALS: Goals reviewed with patient? Yes  SHORT TERM GOALS: Target date: 06/01/22  Pt will be provided with and educated on HEP to improve pain and mobility in RUE.   Goal status:  INITIAL   ASSESSMENT:  CLINICAL IMPRESSION: Patient is a 67 y.o. female who was seen today for occupational therapy evaluation for right shoulder pain. Pt reports she is doing much better than when the pain began in 11/23.  Pt demonstrating full ROM and good strength in RUE. Minimal fascial restrictions palpated. Pt reporting she only has pain at night and the occasional pain during the day when she is sitting, never has pain with movement. Educated pt on pain management strategies to incorporate at night, provided and reviewed HEP. Pt performing exercises with good form. Pt agreeable with the plan.   PERFORMANCE DEFICITS: in functional skills including ADLs, IADLs, coordination, ROM, strength, pain, fascial restrictions, endurance, and UE functional use  IMPAIRMENTS: are limiting patient from ADLs, IADLs, rest and sleep, and leisure.   COMORBIDITIES: has no other co-morbidities that affects occupational performance. Patient will benefit from skilled OT to address above impairments and improve overall function.  MODIFICATION OR ASSISTANCE TO COMPLETE EVALUATION: No modification of tasks or assist necessary to complete an evaluation.  OT OCCUPATIONAL PROFILE AND HISTORY: Problem focused assessment: Including review of records relating to presenting problem.  CLINICAL DECISION MAKING: LOW - limited treatment options, no task modification necessary  REHAB POTENTIAL: Good  EVALUATION COMPLEXITY: Low      PLAN:  OT FREQUENCY: one time visit  OT DURATION: 1 week  PLANNED INTERVENTIONS: patient/family education  RECOMMENDED OTHER SERVICES: None  CONSULTED AND AGREED WITH PLAN OF CARE: Patient  PLAN FOR NEXT SESSION: N/A-HEP only    Guadelupe Sabin, OTR/L  [phone] 06/01/2022, 10:13 AM

## 2022-06-07 ENCOUNTER — Other Ambulatory Visit: Payer: Self-pay | Admitting: Family Medicine

## 2022-06-07 DIAGNOSIS — L03115 Cellulitis of right lower limb: Secondary | ICD-10-CM

## 2022-06-07 MED ORDER — MUPIROCIN 2 % EX OINT: 1.0000 | TOPICAL_OINTMENT | Freq: Two times a day (BID) | CUTANEOUS | 0 refills | Status: DC

## 2022-06-08 MED ORDER — MUPIROCIN 2 % EX OINT: 1.0000 | TOPICAL_OINTMENT | Freq: Two times a day (BID) | CUTANEOUS | 0 refills | Status: DC

## 2022-06-21 ENCOUNTER — Ambulatory Visit: Payer: No Typology Code available for payment source

## 2022-06-21 ENCOUNTER — Other Ambulatory Visit: Payer: Self-pay | Admitting: Family Medicine

## 2022-06-21 DIAGNOSIS — L03115 Cellulitis of right lower limb: Secondary | ICD-10-CM

## 2022-06-21 MED ORDER — MUPIROCIN 2 % EX OINT: 1.0000 | TOPICAL_OINTMENT | Freq: Two times a day (BID) | CUTANEOUS | 4 refills | Status: DC

## 2022-06-21 NOTE — Telephone Encounter (Signed)
Kindly inform the patient  that a refill of  mupirocin 2% ointment is sent to her pharmacy with 3 refills.

## 2022-06-27 ENCOUNTER — Other Ambulatory Visit: Payer: Self-pay | Admitting: Family Medicine

## 2022-06-27 DIAGNOSIS — I10 Essential (primary) hypertension: Secondary | ICD-10-CM

## 2022-06-28 ENCOUNTER — Other Ambulatory Visit: Payer: Self-pay | Admitting: Family Medicine

## 2022-06-28 DIAGNOSIS — I10 Essential (primary) hypertension: Secondary | ICD-10-CM

## 2022-06-28 MED ORDER — LOSARTAN POTASSIUM 50 MG PO TABS: 50.0000 mg | ORAL_TABLET | Freq: Every day | ORAL | 0 refills | Status: DC

## 2022-07-05 DIAGNOSIS — E038 Other specified hypothyroidism: Secondary | ICD-10-CM | POA: Diagnosis not present

## 2022-07-06 ENCOUNTER — Other Ambulatory Visit: Payer: Self-pay | Admitting: Family Medicine

## 2022-07-06 ENCOUNTER — Encounter: Payer: Self-pay | Admitting: Radiology

## 2022-07-06 DIAGNOSIS — E038 Other specified hypothyroidism: Secondary | ICD-10-CM

## 2022-07-06 LAB — TSH+FREE T4
Free T4: 1.68 ng/dL (ref 0.82–1.77)
TSH: 5.26 u[IU]/mL — ABNORMAL HIGH (ref 0.450–4.500)

## 2022-07-06 MED ORDER — LEVOTHYROXINE SODIUM 100 MCG PO TABS: 100.0000 ug | ORAL_TABLET | Freq: Every day | ORAL | 1 refills | Status: DC

## 2022-07-21 ENCOUNTER — Ambulatory Visit: Payer: No Typology Code available for payment source | Admitting: Family Medicine

## 2022-07-28 ENCOUNTER — Ambulatory Visit (INDEPENDENT_AMBULATORY_CARE_PROVIDER_SITE_OTHER): Payer: No Typology Code available for payment source

## 2022-07-28 DIAGNOSIS — Z23 Encounter for immunization: Secondary | ICD-10-CM | POA: Diagnosis not present

## 2022-08-08 ENCOUNTER — Encounter: Payer: No Typology Code available for payment source | Admitting: Rheumatology

## 2022-08-15 ENCOUNTER — Other Ambulatory Visit: Payer: Self-pay | Admitting: Family Medicine

## 2022-08-15 ENCOUNTER — Telehealth: Payer: Self-pay | Admitting: Family Medicine

## 2022-08-15 DIAGNOSIS — L03115 Cellulitis of right lower limb: Secondary | ICD-10-CM

## 2022-08-15 MED ORDER — MUPIROCIN 2 % EX OINT: 1.0000 | TOPICAL_OINTMENT | Freq: Two times a day (BID) | CUTANEOUS | 4 refills | Status: DC

## 2022-08-15 NOTE — Telephone Encounter (Signed)
Rx sent 

## 2022-08-15 NOTE — Telephone Encounter (Signed)
Med refill  mupirocin ointment (BACTROBAN) 2 % [[id number]]   Pharmacy: [facility]

## 2022-08-18 DIAGNOSIS — E038 Other specified hypothyroidism: Secondary | ICD-10-CM | POA: Diagnosis not present

## 2022-08-19 LAB — TSH+FREE T4
Free T4: 1.43 ng/dL (ref 0.82–1.77)
TSH: 2.92 u[IU]/mL (ref 0.450–4.500)

## 2022-08-21 NOTE — Progress Notes (Signed)
Please inform the patient that her thyroid levels are stable

## 2022-08-28 ENCOUNTER — Encounter: Payer: Self-pay | Admitting: Family Medicine

## 2022-08-28 ENCOUNTER — Ambulatory Visit (INDEPENDENT_AMBULATORY_CARE_PROVIDER_SITE_OTHER): Payer: No Typology Code available for payment source | Admitting: Family Medicine

## 2022-08-28 VITALS — BP 128/82 | HR 69 | Ht 67.5 in | Wt 150.0 lb

## 2022-08-28 DIAGNOSIS — M3500 Sicca syndrome, unspecified: Secondary | ICD-10-CM | POA: Diagnosis not present

## 2022-08-28 DIAGNOSIS — I10 Essential (primary) hypertension: Secondary | ICD-10-CM | POA: Diagnosis not present

## 2022-08-28 MED ORDER — PREDNISONE 5 MG PO TABS: 5.0000 mg | ORAL_TABLET | Freq: Every day | ORAL | 0 refills | Status: DC

## 2022-08-28 NOTE — Assessment & Plan Note (Signed)
Controlled She takes losartan 50 mg daily and reports compliance with treatment regimen She notes low-sodium diet with increased physical activities BP Readings from Last 3 Encounters:  08/28/22 128/82  05/29/22 138/86  04/07/22 (!) 152/86

## 2022-08-28 NOTE — Patient Instructions (Signed)
I appreciate the opportunity to provide care to you today!    Follow up:  4 months  Labs: next visit     Please continue to a heart-healthy diet and increase your physical activities. Try to exercise for at least five days a week.      It was a pleasure to see you and I look forward to continuing to work together on your health and well-being. Please do not hesitate to call the office if you need care or have questions about your care.   Have a wonderful day and week. With Gratitude, Gilmore Laroche MSN, FNP-BC

## 2022-08-28 NOTE — Assessment & Plan Note (Signed)
Recent flair up  Has not yet follow-up with rheumatology Will provide a short supply of prednisone 5 mg encouraged the patient to follow-up up with rheumatology

## 2022-08-28 NOTE — Progress Notes (Signed)
Established Patient Office Visit  Subjective:  Patient ID: Kimberly Singh, female    DOB: July 02, 1955  Age: 67 y.o. MRN: [id number]  CC:  Chief Complaint  Patient presents with   Follow-up    3 month f/u    HPI Kimberly Singh is a 67 y.o. female with past medical history of high blood pressure and Sjogren's syndrome presents for f/u of  chronic medical conditions.  For the details of today's visit, please refer to the assessment and plan.     Past Medical History:  Diagnosis Date   Sjogren's syndrome    Spider varicose veins     History reviewed. No pertinent surgical history.  History reviewed. No pertinent family history.  Social History   Socioeconomic History   Marital status: Married    Spouse name: Not on file   Number of children: Not on file   Years of education: Not on file   Highest education level: Not on file  Occupational History   Not on file  Tobacco Use   Smoking status: Never   Smokeless tobacco: Never  Substance and Sexual Activity   Alcohol use: Not Currently   Drug use: Never   Sexual activity: Yes    Partners: Male  Other Topics Concern   Not on file  Social History Narrative   Not on file   Social Determinants of Health   Financial Resource Strain: Low Risk  (01/20/2022)   Overall Financial Resource Strain (CARDIA)    Difficulty of Paying Living Expenses: Not hard at all  Food Insecurity: No Food Insecurity (01/20/2022)   Hunger Vital Sign    Worried About Running Out of Food in the Last Year: Never true    Ran Out of Food in the Last Year: Never true  Transportation Needs: No Transportation Needs (01/20/2022)   PRAPARE - Administrator, Civil Service (Medical): No    Lack of Transportation (Non-Medical): No  Physical Activity: Sufficiently Active (01/20/2022)   Exercise Vital Sign    Days of Exercise per Week: 4 days    Minutes of Exercise per Session: 40 min  Stress: No Stress Concern Present (01/20/2022)   [employer] of Occupational Health - Occupational Stress Questionnaire    Feeling of Stress : Not at all  Social Connections: Socially Integrated (01/20/2022)   Social Connection and Isolation Panel [NHANES]    Frequency of Communication with Friends and Family: Three times a week    Frequency of Social Gatherings with Friends and Family: More than three times a week    Attends Religious Services: 1 to 4 times per year    Active Member of [employer] or Organizations: Yes    Attends Engineer, structural: More than 4 times per year    Marital Status: Married  Catering manager Violence: Not At Risk (01/20/2022)   Humiliation, Afraid, Rape, and Kick questionnaire    Fear of Current or Ex-Partner: No    Emotionally Abused: No    Physically Abused: No    Sexually Abused: No    Outpatient Medications Prior to Visit  Medication Sig Dispense Refill   Ascorbic Acid (VITAMIN C PO) Take 500 mg by mouth. Once daily     camphor-menthol (SARNA) lotion Apply 1 Application topically as needed for itching.     Cholecalciferol (VITAMIN D3 PO) Take 5,000 Units by mouth. Once daily     diclofenac Sodium (VOLTAREN ARTHRITIS PAIN) 1 % GEL Apply 2 g topically 4 (four) times  daily. 2 g 0   hydrochlorothiazide (HYDRODIURIL) 12.5 MG tablet Take 1 tablet (12.5 mg total) by mouth daily as needed. 20 tablet 0   hydrOXYzine (VISTARIL) 25 MG capsule Take 1 capsule (25 mg total) by mouth every 8 (eight) hours as needed. 30 capsule 0   levothyroxine (SYNTHROID) 100 MCG tablet Take 1 tablet (100 mcg total) by mouth daily. 90 tablet 1   losartan (COZAAR) 50 MG tablet Take 1 tablet (50 mg total) by mouth daily. 100 tablet 0   Multiple Vitamin (MULTIVITAMIN ADULT PO) Take by mouth. daily     mupirocin ointment (BACTROBAN) 2 % Apply 1 Application topically 2 (two) times daily. 22 g 0   mupirocin ointment (BACTROBAN) 2 % Apply 1 Application topically 2 (two) times daily. 30 g 4   naproxen (NAPROSYN) 500 MG tablet Take 1  tablet (500 mg total) by mouth 2 (two) times daily with a meal. 60 tablet 5   omeprazole (PRILOSEC) 40 MG capsule Take 1 capsule (40 mg total) by mouth daily. 90 capsule 1   triamcinolone cream (KENALOG) 0.1 % Apply 1 Application topically 2 (two) times daily.     VITAMIN E PO Take 400 Units by mouth. Once daily     No facility-administered medications prior to visit.    No Known Allergies  ROS Review of Systems  Constitutional:  Negative for chills and fever.  Eyes:  Negative for visual disturbance.  Respiratory:  Negative for chest tightness and shortness of breath.   Neurological:  Negative for dizziness and headaches.      Objective:    Physical Exam HENT:     Head: Normocephalic.     Mouth/Throat:     Mouth: Mucous membranes are moist.  Cardiovascular:     Rate and Rhythm: Normal rate.     Heart sounds: Normal heart sounds.  Pulmonary:     Effort: Pulmonary effort is normal.     Breath sounds: Normal breath sounds.  Neurological:     Mental Status: She is alert.     BP 128/82   Pulse 69   Ht 5' 7.5" (1.715 m)   Wt 150 lb 0.6 oz (68.1 kg)   SpO2 100%   BMI 23.15 kg/m  Wt Readings from Last 3 Encounters:  08/28/22 150 lb 0.6 oz (68.1 kg)  05/29/22 147 lb 1.9 oz (66.7 kg)  04/07/22 147 lb (66.7 kg)    Lab Results  Component Value Date   TSH 2.920 08/18/2022   Lab Results  Component Value Date   WBC 5.1 05/19/2022   HGB 12.0 05/19/2022   HCT 35.4 05/19/2022   MCV 90 05/19/2022   PLT 296 05/19/2022   Lab Results  Component Value Date   NA 141 05/19/2022   K 4.5 05/19/2022   CO2 22 05/19/2022   GLUCOSE 84 05/19/2022   BUN 18 05/19/2022   CREATININE 0.82 05/19/2022   BILITOT 0.3 05/19/2022   ALKPHOS 121 05/19/2022   AST 28 05/19/2022   ALT 20 05/19/2022   PROT 6.8 05/19/2022   ALBUMIN 4.1 05/19/2022   CALCIUM 8.5 (L) 05/19/2022   EGFR 78 05/19/2022   Lab Results  Component Value Date   CHOL 166 05/19/2022   Lab Results  Component Value  Date   HDL 60 05/19/2022   Lab Results  Component Value Date   LDLCALC 92 05/19/2022   Lab Results  Component Value Date   TRIG 74 05/19/2022   Lab Results  Component Value Date  CHOLHDL 2.8 05/19/2022   Lab Results  Component Value Date   HGBA1C 5.6 05/19/2022      Assessment & Plan:  Primary hypertension Assessment & Plan: Controlled She takes losartan 50 mg daily and reports compliance with treatment regimen She notes low-sodium diet with increased physical activities BP Readings from Last 3 Encounters:  08/28/22 128/82  05/29/22 138/86  04/07/22 (!) 152/86      Sjogren's syndrome, with unspecified organ involvement Assessment & Plan: Recent flair up  Has not yet follow-up with rheumatology Will provide a short supply of prednisone 5 mg encouraged the patient to follow-up up with rheumatology   Orders: -     predniSONE; Take 1 tablet (5 mg total) by mouth daily with breakfast.  Dispense: 10 tablet; Refill: 0    Follow-up: Return in about 4 months (around 12/28/2022).   Gilmore Laroche, FNP

## 2022-08-29 ENCOUNTER — Ambulatory Visit: Payer: No Typology Code available for payment source | Admitting: Rheumatology

## 2022-09-03 ENCOUNTER — Other Ambulatory Visit: Payer: Self-pay | Admitting: Family Medicine

## 2022-09-03 DIAGNOSIS — R12 Heartburn: Secondary | ICD-10-CM

## 2022-09-03 MED ORDER — OMEPRAZOLE 40 MG PO CPDR: 40.0000 mg | DELAYED_RELEASE_CAPSULE | ORAL | 1 refills | Status: DC | PRN

## 2022-10-03 ENCOUNTER — Other Ambulatory Visit: Payer: Self-pay

## 2022-10-03 ENCOUNTER — Telehealth: Payer: Self-pay | Admitting: Family Medicine

## 2022-10-03 DIAGNOSIS — I10 Essential (primary) hypertension: Secondary | ICD-10-CM

## 2022-10-03 MED ORDER — LOSARTAN POTASSIUM 50 MG PO TABS: 50.0000 mg | ORAL_TABLET | Freq: Every day | ORAL | 0 refills | Status: DC

## 2022-10-03 NOTE — Telephone Encounter (Signed)
Refill sent.

## 2022-10-03 NOTE — Telephone Encounter (Signed)
Pt called and requested refills    losartan (COZAAR) 50 MG tablet [[id number]]

## 2022-10-05 ENCOUNTER — Other Ambulatory Visit: Payer: Self-pay | Admitting: Family Medicine

## 2022-10-05 DIAGNOSIS — R12 Heartburn: Secondary | ICD-10-CM

## 2022-10-05 DIAGNOSIS — E038 Other specified hypothyroidism: Secondary | ICD-10-CM

## 2022-10-09 ENCOUNTER — Encounter: Payer: Self-pay | Admitting: Family Medicine

## 2022-10-10 ENCOUNTER — Other Ambulatory Visit: Payer: Self-pay

## 2022-10-10 DIAGNOSIS — L03115 Cellulitis of right lower limb: Secondary | ICD-10-CM

## 2022-10-10 MED ORDER — MUPIROCIN 2 % EX OINT
1.0000 | TOPICAL_OINTMENT | Freq: Two times a day (BID) | CUTANEOUS | 4 refills | Status: DC
Start: 2022-10-10 — End: 2023-01-22

## 2022-10-20 DIAGNOSIS — M9902 Segmental and somatic dysfunction of thoracic region: Secondary | ICD-10-CM | POA: Diagnosis not present

## 2022-10-20 DIAGNOSIS — M5412 Radiculopathy, cervical region: Secondary | ICD-10-CM | POA: Diagnosis not present

## 2022-10-20 DIAGNOSIS — M546 Pain in thoracic spine: Secondary | ICD-10-CM | POA: Diagnosis not present

## 2022-10-20 DIAGNOSIS — M9901 Segmental and somatic dysfunction of cervical region: Secondary | ICD-10-CM | POA: Diagnosis not present

## 2022-10-23 DIAGNOSIS — M546 Pain in thoracic spine: Secondary | ICD-10-CM | POA: Diagnosis not present

## 2022-10-23 DIAGNOSIS — M9902 Segmental and somatic dysfunction of thoracic region: Secondary | ICD-10-CM | POA: Diagnosis not present

## 2022-10-23 DIAGNOSIS — M9901 Segmental and somatic dysfunction of cervical region: Secondary | ICD-10-CM | POA: Diagnosis not present

## 2022-10-23 DIAGNOSIS — M5412 Radiculopathy, cervical region: Secondary | ICD-10-CM | POA: Diagnosis not present

## 2022-10-27 DIAGNOSIS — M9901 Segmental and somatic dysfunction of cervical region: Secondary | ICD-10-CM | POA: Diagnosis not present

## 2022-10-27 DIAGNOSIS — M5412 Radiculopathy, cervical region: Secondary | ICD-10-CM | POA: Diagnosis not present

## 2022-10-27 DIAGNOSIS — M9902 Segmental and somatic dysfunction of thoracic region: Secondary | ICD-10-CM | POA: Diagnosis not present

## 2022-10-27 DIAGNOSIS — M546 Pain in thoracic spine: Secondary | ICD-10-CM | POA: Diagnosis not present

## 2022-11-01 DIAGNOSIS — M9901 Segmental and somatic dysfunction of cervical region: Secondary | ICD-10-CM | POA: Diagnosis not present

## 2022-11-01 DIAGNOSIS — M5412 Radiculopathy, cervical region: Secondary | ICD-10-CM | POA: Diagnosis not present

## 2022-11-01 DIAGNOSIS — M546 Pain in thoracic spine: Secondary | ICD-10-CM | POA: Diagnosis not present

## 2022-11-01 DIAGNOSIS — M9902 Segmental and somatic dysfunction of thoracic region: Secondary | ICD-10-CM | POA: Diagnosis not present

## 2022-11-06 DIAGNOSIS — M546 Pain in thoracic spine: Secondary | ICD-10-CM | POA: Diagnosis not present

## 2022-11-06 DIAGNOSIS — M5412 Radiculopathy, cervical region: Secondary | ICD-10-CM | POA: Diagnosis not present

## 2022-11-06 DIAGNOSIS — M9901 Segmental and somatic dysfunction of cervical region: Secondary | ICD-10-CM | POA: Diagnosis not present

## 2022-11-06 DIAGNOSIS — M9902 Segmental and somatic dysfunction of thoracic region: Secondary | ICD-10-CM | POA: Diagnosis not present

## 2022-11-08 DIAGNOSIS — M9901 Segmental and somatic dysfunction of cervical region: Secondary | ICD-10-CM | POA: Diagnosis not present

## 2022-11-08 DIAGNOSIS — M9902 Segmental and somatic dysfunction of thoracic region: Secondary | ICD-10-CM | POA: Diagnosis not present

## 2022-11-08 DIAGNOSIS — M546 Pain in thoracic spine: Secondary | ICD-10-CM | POA: Diagnosis not present

## 2022-11-08 DIAGNOSIS — M5412 Radiculopathy, cervical region: Secondary | ICD-10-CM | POA: Diagnosis not present

## 2022-11-17 DIAGNOSIS — M5412 Radiculopathy, cervical region: Secondary | ICD-10-CM | POA: Diagnosis not present

## 2022-11-17 DIAGNOSIS — M9902 Segmental and somatic dysfunction of thoracic region: Secondary | ICD-10-CM | POA: Diagnosis not present

## 2022-11-17 DIAGNOSIS — M9901 Segmental and somatic dysfunction of cervical region: Secondary | ICD-10-CM | POA: Diagnosis not present

## 2022-11-17 DIAGNOSIS — M546 Pain in thoracic spine: Secondary | ICD-10-CM | POA: Diagnosis not present

## 2022-12-01 DIAGNOSIS — M62830 Muscle spasm of back: Secondary | ICD-10-CM | POA: Diagnosis not present

## 2022-12-01 DIAGNOSIS — M9902 Segmental and somatic dysfunction of thoracic region: Secondary | ICD-10-CM | POA: Diagnosis not present

## 2022-12-01 DIAGNOSIS — M9903 Segmental and somatic dysfunction of lumbar region: Secondary | ICD-10-CM | POA: Diagnosis not present

## 2022-12-01 DIAGNOSIS — M546 Pain in thoracic spine: Secondary | ICD-10-CM | POA: Diagnosis not present

## 2022-12-01 DIAGNOSIS — M9901 Segmental and somatic dysfunction of cervical region: Secondary | ICD-10-CM | POA: Diagnosis not present

## 2022-12-01 DIAGNOSIS — M5412 Radiculopathy, cervical region: Secondary | ICD-10-CM | POA: Diagnosis not present

## 2022-12-11 DIAGNOSIS — M9902 Segmental and somatic dysfunction of thoracic region: Secondary | ICD-10-CM | POA: Diagnosis not present

## 2022-12-11 DIAGNOSIS — M5412 Radiculopathy, cervical region: Secondary | ICD-10-CM | POA: Diagnosis not present

## 2022-12-11 DIAGNOSIS — M62830 Muscle spasm of back: Secondary | ICD-10-CM | POA: Diagnosis not present

## 2022-12-11 DIAGNOSIS — M9903 Segmental and somatic dysfunction of lumbar region: Secondary | ICD-10-CM | POA: Diagnosis not present

## 2022-12-11 DIAGNOSIS — M546 Pain in thoracic spine: Secondary | ICD-10-CM | POA: Diagnosis not present

## 2022-12-11 DIAGNOSIS — M9901 Segmental and somatic dysfunction of cervical region: Secondary | ICD-10-CM | POA: Diagnosis not present

## 2022-12-28 ENCOUNTER — Ambulatory Visit: Payer: No Typology Code available for payment source | Admitting: Family Medicine

## 2022-12-29 DIAGNOSIS — M9902 Segmental and somatic dysfunction of thoracic region: Secondary | ICD-10-CM | POA: Diagnosis not present

## 2022-12-29 DIAGNOSIS — M9903 Segmental and somatic dysfunction of lumbar region: Secondary | ICD-10-CM | POA: Diagnosis not present

## 2022-12-29 DIAGNOSIS — M546 Pain in thoracic spine: Secondary | ICD-10-CM | POA: Diagnosis not present

## 2022-12-29 DIAGNOSIS — M62830 Muscle spasm of back: Secondary | ICD-10-CM | POA: Diagnosis not present

## 2022-12-29 DIAGNOSIS — M9901 Segmental and somatic dysfunction of cervical region: Secondary | ICD-10-CM | POA: Diagnosis not present

## 2022-12-29 DIAGNOSIS — M5412 Radiculopathy, cervical region: Secondary | ICD-10-CM | POA: Diagnosis not present

## 2023-01-10 ENCOUNTER — Telehealth: Payer: Self-pay | Admitting: Family Medicine

## 2023-01-10 ENCOUNTER — Other Ambulatory Visit: Payer: Self-pay

## 2023-01-10 DIAGNOSIS — E038 Other specified hypothyroidism: Secondary | ICD-10-CM

## 2023-01-10 MED ORDER — LEVOTHYROXINE SODIUM 100 MCG PO TABS
100.0000 ug | ORAL_TABLET | Freq: Every day | ORAL | 1 refills | Status: DC
Start: 2023-01-10 — End: 2023-01-17

## 2023-01-10 NOTE — Telephone Encounter (Signed)
Refill sent.

## 2023-01-10 NOTE — Telephone Encounter (Signed)
Patient LVM needing a refill on levothyroxine (SYNTHROID) 100 MCG tablet [[id number]]  to hold her off until her appt 9/11. Please advise [facility] on [address]  Thank you

## 2023-01-11 ENCOUNTER — Other Ambulatory Visit: Payer: Self-pay | Admitting: Family Medicine

## 2023-01-11 ENCOUNTER — Other Ambulatory Visit: Payer: Self-pay

## 2023-01-11 DIAGNOSIS — I10 Essential (primary) hypertension: Secondary | ICD-10-CM

## 2023-01-11 DIAGNOSIS — Z1231 Encounter for screening mammogram for malignant neoplasm of breast: Secondary | ICD-10-CM

## 2023-01-11 MED ORDER — LOSARTAN POTASSIUM 50 MG PO TABS
50.0000 mg | ORAL_TABLET | Freq: Every day | ORAL | 0 refills | Status: DC
Start: 2023-01-11 — End: 2023-01-17

## 2023-01-17 ENCOUNTER — Ambulatory Visit (INDEPENDENT_AMBULATORY_CARE_PROVIDER_SITE_OTHER): Payer: No Typology Code available for payment source | Admitting: Family Medicine

## 2023-01-17 ENCOUNTER — Encounter: Payer: Self-pay | Admitting: Family Medicine

## 2023-01-17 VITALS — BP 128/74 | HR 81 | Ht 67.5 in | Wt 149.1 lb

## 2023-01-17 DIAGNOSIS — L308 Other specified dermatitis: Secondary | ICD-10-CM | POA: Diagnosis not present

## 2023-01-17 DIAGNOSIS — I872 Venous insufficiency (chronic) (peripheral): Secondary | ICD-10-CM

## 2023-01-17 DIAGNOSIS — R7301 Impaired fasting glucose: Secondary | ICD-10-CM

## 2023-01-17 DIAGNOSIS — E7849 Other hyperlipidemia: Secondary | ICD-10-CM

## 2023-01-17 DIAGNOSIS — E038 Other specified hypothyroidism: Secondary | ICD-10-CM | POA: Diagnosis not present

## 2023-01-17 DIAGNOSIS — I10 Essential (primary) hypertension: Secondary | ICD-10-CM | POA: Diagnosis not present

## 2023-01-17 DIAGNOSIS — Z23 Encounter for immunization: Secondary | ICD-10-CM | POA: Diagnosis not present

## 2023-01-17 DIAGNOSIS — E559 Vitamin D deficiency, unspecified: Secondary | ICD-10-CM

## 2023-01-17 MED ORDER — HYDROXYZINE PAMOATE 25 MG PO CAPS
25.0000 mg | ORAL_CAPSULE | Freq: Three times a day (TID) | ORAL | 0 refills | Status: DC | PRN
Start: 2023-01-17 — End: 2023-01-26

## 2023-01-17 MED ORDER — LEVOTHYROXINE SODIUM 100 MCG PO TABS
100.0000 ug | ORAL_TABLET | Freq: Every day | ORAL | 1 refills | Status: DC
Start: 2023-01-17 — End: 2023-07-13

## 2023-01-17 MED ORDER — LOSARTAN POTASSIUM 50 MG PO TABS
50.0000 mg | ORAL_TABLET | Freq: Every day | ORAL | 1 refills | Status: DC
Start: 2023-01-17 — End: 2023-06-21

## 2023-01-17 NOTE — Patient Instructions (Addendum)
I appreciate the opportunity to provide care to you today!    Follow up:  5 months  Labs: please stop by the lab during the week to get your blood drawn (CBC, CMP, TSH, Lipid profile, HgA1c, Vit D)   Attached with your AVS, you will find valuable resources for self-education. I highly recommend dedicating some time to thoroughly examine them.   Please continue to a heart-healthy diet and increase your physical activities. Try to exercise for 30mins at least five days a week.    It was a pleasure to see you and I look forward to continuing to work together on your health and well-being. Please do not hesitate to call the office if you need care or have questions about your care.  In case of emergency, please visit the Emergency Department for urgent care, or contact our clinic at [phone] to schedule an appointment. We're here to help you!   Have a wonderful day and week. With Gratitude, Gloria Zarwolo MSN, FNP-BC  

## 2023-01-17 NOTE — Progress Notes (Signed)
Established Patient Office Visit  Subjective:  Patient ID: Kimberly Singh, female    DOB: [birth year]  Age: 67 y.o. MRN: [id number]  CC:  Chief Complaint  Patient presents with   Care Management    4 month f/u    HPI Kimberly Singh is a 67 y.o. female with past medical history of primary hypertension, and chronic venous insufficiency presents for f/u of  chronic medical conditions. For the details of today's visit, please refer to the assessment and plan.     Past Medical History:  Diagnosis Date   Sjogren's syndrome (HCC)    Spider varicose veins     History reviewed. No pertinent surgical history.  History reviewed. No pertinent family history.  Social History   Socioeconomic History   Marital status: Married    Spouse name: Not on file   Number of children: Not on file   Years of education: Not on file   Highest education level: Not on file  Occupational History   Not on file  Tobacco Use   Smoking status: Never   Smokeless tobacco: Never  Substance and Sexual Activity   Alcohol use: Not Currently   Drug use: Never   Sexual activity: Yes    Partners: Male  Other Topics Concern   Not on file  Social History Narrative   Not on file   Social Determinants of Health   Financial Resource Strain: Low Risk  (01/20/2022)   Overall Financial Resource Strain (CARDIA)    Difficulty of Paying Living Expenses: Not hard at all  Food Insecurity: No Food Insecurity (01/20/2022)   Hunger Vital Sign    Worried About Running Out of Food in the Last Year: Never true    Ran Out of Food in the Last Year: Never true  Transportation Needs: No Transportation Needs (01/20/2022)   PRAPARE - Administrator, Civil Service (Medical): No    Lack of Transportation (Non-Medical): No  Physical Activity: Sufficiently Active (01/20/2022)   Exercise Vital Sign    Days of Exercise per Week: 4 days    Minutes of Exercise per Session: 40 min  Stress: No Stress Concern Present  (01/20/2022)   [employer] of Occupational Health - Occupational Stress Questionnaire    Feeling of Stress : Not at all  Social Connections: Socially Integrated (01/20/2022)   Social Connection and Isolation Panel [NHANES]    Frequency of Communication with Friends and Family: Three times a week    Frequency of Social Gatherings with Friends and Family: More than three times a week    Attends Religious Services: 1 to 4 times per year    Active Member of [employer] or Organizations: Yes    Attends Engineer, structural: More than 4 times per year    Marital Status: Married  Catering manager Violence: Not At Risk (01/20/2022)   Humiliation, Afraid, Rape, and Kick questionnaire    Fear of Current or Ex-Partner: No    Emotionally Abused: No    Physically Abused: No    Sexually Abused: No    Outpatient Medications Prior to Visit  Medication Sig Dispense Refill   Ascorbic Acid (VITAMIN C PO) Take 500 mg by mouth. Once daily     camphor-menthol (SARNA) lotion Apply 1 Application topically as needed for itching.     Cholecalciferol (VITAMIN D3 PO) Take 5,000 Units by mouth. Once daily     diclofenac Sodium (VOLTAREN ARTHRITIS PAIN) 1 % GEL Apply 2 g topically 4 (four)  times daily. 2 g 0   hydrochlorothiazide (HYDRODIURIL) 12.5 MG tablet Take 1 tablet (12.5 mg total) by mouth daily as needed. 20 tablet 0   Multiple Vitamin (MULTIVITAMIN ADULT PO) Take by mouth. daily     mupirocin ointment (BACTROBAN) 2 % Apply 1 Application topically 2 (two) times daily. 22 g 0   mupirocin ointment (BACTROBAN) 2 % Apply 1 Application topically 2 (two) times daily. 30 g 4   naproxen (NAPROSYN) 500 MG tablet Take 1 tablet (500 mg total) by mouth 2 (two) times daily with a meal. 60 tablet 5   omeprazole (PRILOSEC) 40 MG capsule TAKE 1 CAPSULE(40 MG) BY MOUTH DAILY 90 capsule 1   predniSONE (DELTASONE) 5 MG tablet Take 1 tablet (5 mg total) by mouth daily with breakfast. 10 tablet 0   triamcinolone cream  (KENALOG) 0.1 % Apply 1 Application topically 2 (two) times daily.     hydrOXYzine (VISTARIL) 25 MG capsule Take 1 capsule (25 mg total) by mouth every 8 (eight) hours as needed. 30 capsule 0   levothyroxine (SYNTHROID) 100 MCG tablet Take 1 tablet (100 mcg total) by mouth daily. 90 tablet 1   losartan (COZAAR) 50 MG tablet Take 1 tablet (50 mg total) by mouth daily. 90 tablet 0   VITAMIN E PO Take 400 Units by mouth. Once daily     No facility-administered medications prior to visit.    No Known Allergies  ROS Review of Systems  Constitutional:  Negative for chills and fever.  Eyes:  Negative for visual disturbance.  Respiratory:  Negative for chest tightness and shortness of breath.   Neurological:  Negative for dizziness and headaches.      Objective:    Physical Exam HENT:     Head: Normocephalic.     Mouth/Throat:     Mouth: Mucous membranes are moist.  Cardiovascular:     Rate and Rhythm: Normal rate.     Heart sounds: Normal heart sounds.  Pulmonary:     Effort: Pulmonary effort is normal.     Breath sounds: Normal breath sounds.  Neurological:     Mental Status: She is alert.     BP 128/74   Pulse 81   Ht 5' 7.5" (1.715 m)   Wt 149 lb 1.3 oz (67.6 kg)   SpO2 94%   BMI 23.00 kg/m  Wt Readings from Last 3 Encounters:  01/17/23 149 lb 1.3 oz (67.6 kg)  08/28/22 150 lb 0.6 oz (68.1 kg)  05/29/22 147 lb 1.9 oz (66.7 kg)    Lab Results  Component Value Date   TSH 2.920 08/18/2022   Lab Results  Component Value Date   WBC 5.1 05/19/2022   HGB 12.0 05/19/2022   HCT 35.4 05/19/2022   MCV 90 05/19/2022   PLT 296 05/19/2022   Lab Results  Component Value Date   NA 141 05/19/2022   K 4.5 05/19/2022   CO2 22 05/19/2022   GLUCOSE 84 05/19/2022   BUN 18 05/19/2022   CREATININE 0.82 05/19/2022   BILITOT 0.3 05/19/2022   ALKPHOS 121 05/19/2022   AST 28 05/19/2022   ALT 20 05/19/2022   PROT 6.8 05/19/2022   ALBUMIN 4.1 05/19/2022   CALCIUM 8.5 (L)  05/19/2022   EGFR 78 05/19/2022   Lab Results  Component Value Date   CHOL 166 05/19/2022   Lab Results  Component Value Date   HDL 60 05/19/2022   Lab Results  Component Value Date   LDLCALC 92  05/19/2022   Lab Results  Component Value Date   TRIG 74 05/19/2022   Lab Results  Component Value Date   CHOLHDL 2.8 05/19/2022   Lab Results  Component Value Date   HGBA1C 5.6 05/19/2022      Assessment & Plan:  Primary hypertension Assessment & Plan: Controlled She takes losartan 50 mg daily and reports compliance with treatment regimen She notes low-sodium diet with increased physical activities BP Readings from Last 3 Encounters:  01/17/23 128/74  08/28/22 128/82  05/29/22 138/86      Orders: -     Losartan Potassium; Take 1 tablet (50 mg total) by mouth daily.  Dispense: 90 tablet; Refill: 1  Stasis dermatitis of both legs Assessment & Plan: Minimal swelling was noted on physical examination. The patient reports relief from excessive itching, dryness, and drainage since starting Bactroban ointment. She is encouraged to continue using the ointment, along with leg elevation and the use of her compression stockings. The patient verbalized understanding of these instructions.    Pruritic dermatitis -     hydrOXYzine Pamoate; Take 1 capsule (25 mg total) by mouth every 8 (eight) hours as needed.  Dispense: 30 capsule; Refill: 0  Other specified hypothyroidism -     Levothyroxine Sodium; Take 1 tablet (100 mcg total) by mouth daily.  Dispense: 90 tablet; Refill: 1 -     TSH + free T4  IFG (impaired fasting glucose) -     Hemoglobin A1c  Vitamin D deficiency -     VITAMIN D 25 Hydroxy (Vit-D Deficiency, Fractures)  Other hyperlipidemia -     Lipid panel -     CMP14+EGFR -     CBC with Differential/Platelet  Note: This chart has been completed using Engineer, civil (consulting) software, and while attempts have been made to ensure accuracy, certain words and  phrases may not be transcribed as intended.    Follow-up: Return in about 5 months (around 06/19/2023).   Gilmore Laroche, FNP

## 2023-01-17 NOTE — Assessment & Plan Note (Signed)
Minimal swelling was noted on physical examination. The patient reports relief from excessive itching, dryness, and drainage since starting Bactroban ointment. She is encouraged to continue using the ointment, along with leg elevation and the use of her compression stockings. The patient verbalized understanding of these instructions.

## 2023-01-17 NOTE — Assessment & Plan Note (Signed)
Controlled She takes losartan 50 mg daily and reports compliance with treatment regimen She notes low-sodium diet with increased physical activities BP Readings from Last 3 Encounters:  01/17/23 128/74  08/28/22 128/82  05/29/22 138/86

## 2023-01-18 ENCOUNTER — Ambulatory Visit: Payer: No Typology Code available for payment source

## 2023-01-18 DIAGNOSIS — E559 Vitamin D deficiency, unspecified: Secondary | ICD-10-CM | POA: Diagnosis not present

## 2023-01-18 DIAGNOSIS — E038 Other specified hypothyroidism: Secondary | ICD-10-CM | POA: Diagnosis not present

## 2023-01-18 DIAGNOSIS — Z23 Encounter for immunization: Secondary | ICD-10-CM | POA: Diagnosis not present

## 2023-01-18 DIAGNOSIS — R7301 Impaired fasting glucose: Secondary | ICD-10-CM | POA: Diagnosis not present

## 2023-01-18 DIAGNOSIS — E7849 Other hyperlipidemia: Secondary | ICD-10-CM | POA: Diagnosis not present

## 2023-01-18 NOTE — Progress Notes (Signed)
Office Visit Note  Patient: Kimberly Singh             Date of Birth: Feb 06, 1956           MRN: [id number]             PCP: Gilmore Laroche, FNP Referring: Gilmore Laroche, FNP Visit Date: 01/26/2023 Occupation: @GUAROCC @  Subjective:  Recurrent rash and positive ANA   History of Present Illness: Kimberly Singh is a 67 y.o. female seen in consultation per request of her PCP.  Patient was accompanied by her husband today.  According to the patient her symptoms started in 2013 while she was lifting [place].  She has started getting rash on her face for which she is to go to urgent care and used to get prednisone shot.  She states it happened quite frequently.  7 years later she got extensive rash on her face and swelling on her eyes.  At the time she was seen at the urgent care and had labs which showed positive ANA.  She was referred to a rheumatologist.  She states she started seeing the rheumatologist in [place] who diagnosed her with Sjogren's based on the lab work.  He advised her to use sunscreen and gave her prednisone as needed for the rash on her face.  She states she would take prednisone for 2 days and the rash will resolve.  No other medications were given.  She states she moved to [place] in May 2023 and started having more frequent breakout because of sun exposure.  She was given prednisone 5 mg p.o. twice daily by her PCP which she took mostly for 3 days until the rash resolved.  As she has taken prednisone a total of 5 times in the last year.  Patient states she has been using sunscreen on more regular basis now.  She did not have to take prednisone in the last 8 months.  She gives history of fatigue and photosensitivity.  There is no history of oral ulcers, nasal ulcers, malar rash, Raynaud's phenomenon, hair loss, inflammatory arthritis or lymphadenopathy.  She denies any history of dry mouth and dry eyes.  There is no family history of autoimmune disease.  She is gravida 2,  para 2.  There is no history of preeclampsia or DVTs.  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  She also enjoys crafts and gardening.  She has been a caregiver for her husband who was diagnosed with cancer.    Activities of Daily Living:  Patient reports morning stiffness for 0 minutes.   Patient Reports nocturnal pain.  Difficulty dressing/grooming: Denies Difficulty climbing stairs: Denies Difficulty getting out of chair: Denies Difficulty using hands for taps, buttons, cutlery, and/or writing: Denies  Review of Systems  Constitutional:  Positive for fatigue.  HENT:  Negative for mouth sores and mouth dryness.   Eyes:  Negative for dryness.  Respiratory:  Negative for shortness of breath.   Cardiovascular:  Positive for palpitations. Negative for chest pain.  Gastrointestinal:  Negative for blood in stool, constipation and diarrhea.  Endocrine: Negative for increased urination.  Genitourinary:  Negative for involuntary urination.  Musculoskeletal:  Negative for joint pain, gait problem, joint pain, joint swelling, myalgias, muscle weakness, morning stiffness, muscle tenderness and myalgias.  Skin:  Positive for sensitivity to sunlight. Negative for color change, rash and hair loss.  Allergic/Immunologic: Negative for susceptible to infections.  Neurological:  Negative for dizziness  and headaches.  Hematological:  Negative for swollen glands.  Psychiatric/Behavioral:  Negative for depressed mood and sleep disturbance. The patient is not nervous/anxious.     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    Past Medical History:  Diagnosis Date   Sjogren's syndrome (HCC)    Spider varicose veins     Family History  Problem Relation Age of Onset   Obesity Mother    Heart attack Mother    Hypertension Mother    Prostate cancer Father    Obesity Brother    COPD Brother    Alcoholism Brother    Healthy Son    Healthy Son    Past Surgical History:  Procedure Laterality Date   CATARACT EXTRACTION, BILATERAL     KNEE ARTHROSCOPY W/ MENISCAL REPAIR Right    TUBAL LIGATION     age 54   WRIST FRACTURE SURGERY Left    Social History   Social History Narrative   Not on file   Immunization History  Administered Date(s) Administered   Fluad Quad(high Dose 65+) 01/20/2022   Fluad Trivalent(High Dose 65+) 01/18/2023   PFIZER(Purple Top)SARS-COV-2 Vaccination 05/30/2019, 04/05/2020   PNEUMOCOCCAL CONJUGATE-20 04/07/2022   Zoster Recombinant(Shingrix) 05/29/2022, 07/28/2022     Objective: Vital Signs: BP (!) 162/80 (BP Location: Left Arm, Patient Position: Sitting, Cuff Size: Normal)   Pulse (!) 52   Resp 15   Ht 5\' 7"  (1.702 m)   Wt 149 lb 3.2 oz (67.7 kg)   BMI 23.37 kg/m    Physical Exam Vitals and nursing note reviewed.  Constitutional:      Appearance: She is well-developed.  HENT:     Head: Normocephalic and atraumatic.  Eyes:     Conjunctiva/sclera: Conjunctivae normal.  Cardiovascular:     Rate and Rhythm: Normal rate and regular rhythm.     Heart sounds: Normal heart sounds.  Pulmonary:     Effort: Pulmonary effort is normal.     Breath sounds: Normal breath sounds.  Abdominal:     General: Bowel sounds are normal.     Palpations: Abdomen is soft.  Musculoskeletal:     Cervical back: Normal range of motion.  Lymphadenopathy:     Cervical: No cervical adenopathy.  Skin:    General: Skin is warm and dry.     Capillary Refill: Capillary refill takes less than 2 seconds.   Neurological:     Mental Status: She is alert and oriented to person, place, and time.  Psychiatric:        Behavior: Behavior normal.      Musculoskeletal Exam: Cervical, thoracic and lumbar spine were in good range of motion.  Shoulder joints, elbow joints, MCPs PIPs and DIPs in good range of motion with no synovitis.  Bilateral PIP and DIP thickening was noted.  She had contracture in her left fourth PIP joint due to previous injury.  She also had limited extension of her left wrist due to previous fracture.  Hip joints and knee joints in good range of motion without any warmth swelling or effusion.  There  was no tenderness over ankles or MTPs.  There was no plantar fasciitis or Achilles tendinitis.  CDAI Exam: CDAI Score: -- Patient Global: --; Provider Global: -- Swollen: --; Tender: -- Joint Exam 01/26/2023   No joint exam has been documented for this visit   There is currently no information documented on the homunculus. Go to the Rheumatology activity and complete the homunculus joint exam.  Investigation: No additional findings.  Imaging: No results found.  Recent Labs: Lab Results  Component Value Date   WBC 3.9 01/18/2023   HGB 11.8 01/18/2023   PLT 294 01/18/2023   NA 141 01/18/2023   K 4.3 01/18/2023   CL 104 01/18/2023   CO2 24 01/18/2023   GLUCOSE 88 01/18/2023   BUN 20 01/18/2023   CREATININE 0.96 01/18/2023   BILITOT 0.3 01/18/2023   ALKPHOS 131 (H) 01/18/2023   AST 27 01/18/2023   ALT 17 01/18/2023   PROT 7.1 01/18/2023   ALBUMIN 4.3 01/18/2023   CALCIUM 9.0 01/18/2023    12/14: ANA 1:160, RNP 1.5, Ro>8 10/11/15: ANA 1:320 speckled, RNP 1.3, Ro>8 11/28/18: ANA 1:640 homogeneous, 1:640 speckled, CRP 6, CPK 170, ESR 29, Lyme negative  05/20/19: ANA 1:320 homogeneous speckled   Speciality Comments: No specialty comments available.  Procedures:  No procedures performed Allergies: Patient has no known allergies.   Assessment / Plan:     Visit  Diagnoses: Sjogren's syndrome with other organ involvement (HCC) - Dx at [place] in [place]. +ANA 1:320homogenous speckled, joint swelling, photosensitivity, +RNP 1.3, +Ro>8, dry eyes, oral ulcers -patient gives history of recurrent rash on her face since 2013.  She has been treated with short bursts of prednisone over the years.  She states she did not have to take prednisone in the last 8 months.  She had been using sunscreen on a regular basis.  She denies any history of sicca symptoms, oral ulcers, nasal ulcers, inflammatory arthritis, hair loss, Raynaud's phenomenon or lymphadenopathy.  She had no synovitis on examination.  No rash was noted today.  Patient states she has been using sunscreen on a regular basis which has been very helpful in preventing recurrence of the rash.  I will obtain autoimmune labs today.  Plan: Protein / creatinine ratio, urine, Sedimentation rate, CK, Rheumatoid factor, ANA, Anti-scleroderma antibody, RNP Antibody, Anti-Smith antibody, Sjogrens syndrome-A extractable nuclear antibody, Sjogrens syndrome-B extractable nuclear antibody, Anti-DNA antibody, double-stranded, C3 and C4, Beta-2 glycoprotein antibodies, Cardiolipin antibodies, IgG, IgM, IgA,   Long-term corticosteroid use -patient has been on short burst of prednisone off-and-on since 2013 for facial rash.  Prednisone 5 mg BID, she has been off prednisone for the last 8 to 9 months per patient.   Primary osteoarthritis of both hands-she denies any discomfort in her hands.  She had limited extension of her left wrist joint due to previous fracture.  She also had contracture of her left fourth PIP joint.  To injury.  Bilateral PIP and DIP thickening with no synovitis was noted.  Primary osteoarthritis of both feet-she denies any discomfort in her feet.  PIP and DIP thickening and bilateral first MTP thickening was noted consistent with osteoarthritis.  Primary hypertension-blood pressure was elevated at 153/80.  Repeat blood  pressure was 162/80.  She was advised to monitor blood pressure closely and follow-up with the PCP.  History of hypothyroidism  Stasis dermatitis of both legs  Spider varicose veins-patient uses compression socks.  Orders: Orders Placed This Encounter  Procedures   Protein / creatinine ratio, urine  Sedimentation rate   CK   Rheumatoid factor   ANA   Anti-scleroderma antibody   RNP Antibody   Anti-Smith antibody   Sjogrens syndrome-A extractable nuclear antibody   Sjogrens syndrome-B extractable nuclear antibody   Anti-DNA antibody, double-stranded   C3 and C4   Beta-2 glycoprotein antibodies   Cardiolipin antibodies, IgG, IgM, IgA   No orders of the defined types were placed in this encounter.    Follow-Up Instructions: Return for Positive ANA and rash.   Pollyann Savoy, MD  Note - This record has been created using Animal nutritionist.  Chart creation errors have been sought, but may not always  have been located. Such creation errors do not reflect on  the standard of medical care.

## 2023-01-19 LAB — TSH+FREE T4
Free T4: 1.26 ng/dL (ref 0.82–1.77)
TSH: 5.51 u[IU]/mL — ABNORMAL HIGH (ref 0.450–4.500)

## 2023-01-19 LAB — CBC WITH DIFFERENTIAL/PLATELET
Basophils Absolute: 0 10*3/uL (ref 0.0–0.2)
Basos: 1 %
EOS (ABSOLUTE): 0.1 10*3/uL (ref 0.0–0.4)
Eos: 2 %
Hematocrit: 34.9 % (ref 34.0–46.6)
Hemoglobin: 11.8 g/dL (ref 11.1–15.9)
Immature Grans (Abs): 0 10*3/uL (ref 0.0–0.1)
Immature Granulocytes: 0 %
Lymphocytes Absolute: 1.4 10*3/uL (ref 0.7–3.1)
Lymphs: 35 %
MCH: 30.7 pg (ref 26.6–33.0)
MCHC: 33.8 g/dL (ref 31.5–35.7)
MCV: 91 fL (ref 79–97)
Monocytes Absolute: 0.4 10*3/uL (ref 0.1–0.9)
Monocytes: 10 %
Neutrophils Absolute: 2.1 10*3/uL (ref 1.4–7.0)
Neutrophils: 52 %
Platelets: 294 10*3/uL (ref 150–450)
RBC: 3.84 x10E6/uL (ref 3.77–5.28)
RDW: 13.2 % (ref 11.7–15.4)
WBC: 3.9 10*3/uL (ref 3.4–10.8)

## 2023-01-19 LAB — CMP14+EGFR
ALT: 17 IU/L (ref 0–32)
AST: 27 IU/L (ref 0–40)
Albumin: 4.3 g/dL (ref 3.9–4.9)
Alkaline Phosphatase: 131 IU/L — ABNORMAL HIGH (ref 44–121)
BUN/Creatinine Ratio: 21 (ref 12–28)
BUN: 20 mg/dL (ref 8–27)
Bilirubin Total: 0.3 mg/dL (ref 0.0–1.2)
CO2: 24 mmol/L (ref 20–29)
Calcium: 9 mg/dL (ref 8.7–10.3)
Chloride: 104 mmol/L (ref 96–106)
Creatinine, Ser: 0.96 mg/dL (ref 0.57–1.00)
Globulin, Total: 2.8 g/dL (ref 1.5–4.5)
Glucose: 88 mg/dL (ref 70–99)
Potassium: 4.3 mmol/L (ref 3.5–5.2)
Sodium: 141 mmol/L (ref 134–144)
Total Protein: 7.1 g/dL (ref 6.0–8.5)
eGFR: 64 mL/min/{1.73_m2} (ref >59)

## 2023-01-19 LAB — LIPID PANEL
Chol/HDL Ratio: 2.9 ratio (ref 0.0–4.4)
Cholesterol, Total: 174 mg/dL (ref 100–199)
HDL: 60 mg/dL (ref >39)
LDL Chol Calc (NIH): 99 mg/dL (ref 0–99)
Triglycerides: 79 mg/dL (ref 0–149)
VLDL Cholesterol Cal: 15 mg/dL (ref 5–40)

## 2023-01-19 LAB — HEMOGLOBIN A1C
Est. average glucose Bld gHb Est-mCnc: 117 mg/dL
Hgb A1c MFr Bld: 5.7 % — ABNORMAL HIGH (ref 4.8–5.6)

## 2023-01-19 LAB — VITAMIN D 25 HYDROXY (VIT D DEFICIENCY, FRACTURES): Vit D, 25-Hydroxy: 52.5 ng/mL (ref 30.0–100.0)

## 2023-01-20 ENCOUNTER — Other Ambulatory Visit: Payer: Self-pay | Admitting: Family Medicine

## 2023-01-20 DIAGNOSIS — E038 Other specified hypothyroidism: Secondary | ICD-10-CM

## 2023-01-22 ENCOUNTER — Encounter: Payer: Self-pay | Admitting: Family Medicine

## 2023-01-22 ENCOUNTER — Other Ambulatory Visit: Payer: Self-pay

## 2023-01-22 DIAGNOSIS — L03115 Cellulitis of right lower limb: Secondary | ICD-10-CM

## 2023-01-22 MED ORDER — MUPIROCIN 2 % EX OINT: 1.0000 | TOPICAL_OINTMENT | Freq: Two times a day (BID) | CUTANEOUS | 4 refills | Status: DC

## 2023-01-26 ENCOUNTER — Ambulatory Visit: Payer: No Typology Code available for payment source | Attending: Rheumatology | Admitting: Rheumatology

## 2023-01-26 ENCOUNTER — Encounter: Payer: Self-pay | Admitting: Rheumatology

## 2023-01-26 VITALS — BP 162/80 | HR 52 | Resp 15 | Ht 67.0 in | Wt 149.2 lb

## 2023-01-26 DIAGNOSIS — I868 Varicose veins of other specified sites: Secondary | ICD-10-CM

## 2023-01-26 DIAGNOSIS — M3509 Sicca syndrome with other organ involvement: Secondary | ICD-10-CM | POA: Diagnosis not present

## 2023-01-26 DIAGNOSIS — M19042 Primary osteoarthritis, left hand: Secondary | ICD-10-CM | POA: Diagnosis not present

## 2023-01-26 DIAGNOSIS — M19072 Primary osteoarthritis, left ankle and foot: Secondary | ICD-10-CM | POA: Diagnosis not present

## 2023-01-26 DIAGNOSIS — I10 Essential (primary) hypertension: Secondary | ICD-10-CM | POA: Diagnosis not present

## 2023-01-26 DIAGNOSIS — M19041 Primary osteoarthritis, right hand: Secondary | ICD-10-CM | POA: Diagnosis not present

## 2023-01-26 DIAGNOSIS — M19071 Primary osteoarthritis, right ankle and foot: Secondary | ICD-10-CM | POA: Diagnosis not present

## 2023-01-26 DIAGNOSIS — I872 Venous insufficiency (chronic) (peripheral): Secondary | ICD-10-CM | POA: Diagnosis not present

## 2023-01-26 DIAGNOSIS — Z8639 Personal history of other endocrine, nutritional and metabolic disease: Secondary | ICD-10-CM

## 2023-01-26 DIAGNOSIS — Z7952 Long term (current) use of systemic steroids: Secondary | ICD-10-CM | POA: Diagnosis not present

## 2023-01-29 ENCOUNTER — Ambulatory Visit: Payer: No Typology Code available for payment source

## 2023-01-29 LAB — RHEUMATOID FACTOR: Rheumatoid fact SerPl-aCnc: 26 IU/mL — ABNORMAL HIGH (ref <14)

## 2023-01-29 LAB — BETA-2 GLYCOPROTEIN ANTIBODIES
Beta-2 Glyco 1 IgA: <2 U/mL (ref <20.0)
Beta-2 Glyco 1 IgM: 2.7 U/mL (ref <20.0)
Beta-2 Glyco I IgG: 2.3 U/mL (ref <20.0)

## 2023-01-29 LAB — CK: Total CK: 164 U/L — ABNORMAL HIGH (ref 29–143)

## 2023-01-29 LAB — PROTEIN / CREATININE RATIO, URINE
Creatinine, Urine: 30 mg/dL (ref 20–275)
Protein/Creat Ratio: 133 mg/g creat (ref 24–184)
Protein/Creatinine Ratio: 0.133 mg/mg creat (ref 0.024–0.184)
Total Protein, Urine: 4 mg/dL — ABNORMAL LOW (ref 5–24)

## 2023-01-29 LAB — CARDIOLIPIN ANTIBODIES, IGG, IGM, IGA
Anticardiolipin IgA: <2 APL-U/mL (ref <20.0)
Anticardiolipin IgG: 3.1 GPL-U/mL (ref <20.0)
Anticardiolipin IgM: 5.1 MPL-U/mL (ref <20.0)

## 2023-01-29 LAB — ANA: Anti Nuclear Antibody (ANA): POSITIVE — AB

## 2023-01-29 LAB — RNP ANTIBODY: Ribonucleic Protein(ENA) Antibody, IgG: 2.7 AI — AB

## 2023-01-29 LAB — ANTI-DNA ANTIBODY, DOUBLE-STRANDED: ds DNA Ab: 1 IU/mL

## 2023-01-29 LAB — C3 AND C4
C3 Complement: 135 mg/dL (ref 83–193)
C4 Complement: 26 mg/dL (ref 15–57)

## 2023-01-29 LAB — ANTI-NUCLEAR AB-TITER (ANA TITER)
ANA TITER: 1:1280 {titer} — ABNORMAL HIGH
ANA Titer 1: 1:80 {titer} — ABNORMAL HIGH

## 2023-01-29 LAB — ANTI-SMITH ANTIBODY: ENA SM Ab Ser-aCnc: <1 AI

## 2023-01-29 LAB — ANTI-SCLERODERMA ANTIBODY: Scleroderma (Scl-70) (ENA) Antibody, IgG: <1 AI

## 2023-01-29 LAB — SJOGRENS SYNDROME-B EXTRACTABLE NUCLEAR ANTIBODY: SSB (La) (ENA) Antibody, IgG: <1 AI

## 2023-01-29 LAB — SJOGRENS SYNDROME-A EXTRACTABLE NUCLEAR ANTIBODY: SSA (Ro) (ENA) Antibody, IgG: >8 AI — AB

## 2023-01-29 LAB — SEDIMENTATION RATE: Sed Rate: 28 mm/h (ref 0–30)

## 2023-01-29 NOTE — Progress Notes (Signed)
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  I plan to start her on hydroxychloroquine at the follow-up visit.  Patient may ead about hydroxychloroquine which will be discussed at the follow-up visit.  She should use sunscreen on a regular basis.

## 2023-02-12 ENCOUNTER — Other Ambulatory Visit: Payer: Self-pay

## 2023-02-12 DIAGNOSIS — A601 Herpesviral infection of perianal skin and rectum: Secondary | ICD-10-CM

## 2023-02-12 MED ORDER — ACYCLOVIR 400 MG PO TABS: 400.0000 mg | ORAL_TABLET | Freq: Three times a day (TID) | ORAL | 0 refills | Status: AC

## 2023-02-20 ENCOUNTER — Ambulatory Visit: Payer: No Typology Code available for payment source | Admitting: Rheumatology

## 2023-04-03 NOTE — Progress Notes (Signed)
Office Visit Note  Patient: Kimberly Singh             Date of Birth: Nov 24, 1955           MRN: [id number]             PCP: Gilmore Laroche, FNP Referring: Gilmore Laroche, FNP Visit Date: 04/17/2023 Occupation: @GUAROCC @  Subjective:  Pain in multiple joints  History of Present Illness: Kimberly Singh is a 67 y.o. female returns today after her initial evaluation on January 26, 2023.  Patient has a known history of mixed connective tissue disease, Sjogren's, osteoarthritis.  She continues to have pain and discomfort in her bilateral hands, bilateral feet.  She complains of photosensitivity.  She is currently not having any symptoms of dry mouth and dry eyes.  She has not had any recent episodes of rash.    Activities of Daily Living:  Patient reports morning stiffness for 0 minutes.   Patient Reports nocturnal pain.  Difficulty dressing/grooming: Denies Difficulty climbing stairs: Denies Difficulty getting out of chair: Denies Difficulty using hands for taps, buttons, cutlery, and/or writing: Denies  Review of Systems  Constitutional:  Negative for fatigue.  HENT:  Negative for mouth sores and mouth dryness.   Eyes:  Negative for pain and dryness.  Respiratory:  Negative for cough, shortness of breath and wheezing.   Cardiovascular:  Negative for chest pain and palpitations.  Gastrointestinal:  Negative for blood in stool, constipation and diarrhea.  Endocrine: Negative for increased urination.  Genitourinary:  Negative for involuntary urination.  Musculoskeletal:  Negative for joint pain, gait problem, joint pain, joint swelling, myalgias, muscle weakness, morning stiffness, muscle tenderness and myalgias.  Skin:  Positive for sensitivity to sunlight. Negative for color change, rash and hair loss.  Allergic/Immunologic: Negative for susceptible to infections.  Neurological:  Negative for dizziness and headaches.  Hematological:  Negative for swollen glands.   Psychiatric/Behavioral:  Negative for depressed mood and sleep disturbance. The patient is not nervous/anxious.     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    Past Medical History:  Diagnosis Date   Sjogren's syndrome (HCC)    Spider varicose veins     Family History  Problem Relation Age of Onset   Obesity Mother    Heart attack Mother    Hypertension Mother    Prostate cancer Father    Obesity Brother    COPD Brother    Alcoholism Brother    Healthy Son    Healthy Son    Past Surgical History:  Procedure Laterality Date   CATARACT EXTRACTION, BILATERAL     CHOLECYSTECTOMY     KNEE ARTHROSCOPY W/ MENISCAL REPAIR Right    TUBAL LIGATION     age 10   WRIST FRACTURE SURGERY Left    Social History   Social History Narrative   Not on file   Immunization History  Administered Date(s) Administered   Fluad Quad(high Dose 65+) 01/20/2022   Fluad Trivalent(High Dose 65+) 01/18/2023   PFIZER(Purple Top)SARS-COV-2 Vaccination 05/30/2019, 04/05/2020   PNEUMOCOCCAL CONJUGATE-20 04/07/2022   Zoster Recombinant(Shingrix)  05/29/2022, 07/28/2022     Objective: Vital Signs: BP (!) 144/77 (BP Location: Left Arm, Patient Position: Sitting, Cuff Size: Normal)   Pulse 61   Resp 15   Ht 5' 7.5" (1.715 m)   Wt 152 lb 12.8 oz (69.3 kg)   BMI 23.58 kg/m    Physical Exam Vitals and nursing note reviewed.  Constitutional:      Appearance: She is well-developed.  HENT:     Head: Normocephalic and atraumatic.  Eyes:     Conjunctiva/sclera: Conjunctivae normal.   Cardiovascular:     Rate and Rhythm: Normal rate and regular rhythm.     Heart sounds: Normal heart sounds.  Pulmonary:     Effort: Pulmonary effort is normal.     Breath sounds: Normal breath sounds.  Abdominal:     General: Bowel sounds are normal.     Palpations: Abdomen is soft.  Musculoskeletal:     Cervical back: Normal range of motion.  Lymphadenopathy:     Cervical: No cervical adenopathy.  Skin:    General: Skin is warm and dry.     Capillary Refill: Capillary refill takes less than 2 seconds.  Neurological:     Mental Status: She is alert and oriented to person, place, and time.  Psychiatric:        Behavior: Behavior normal.      Musculoskeletal Exam: Cervical, thoracic and lumbar spine 1 good range of motion.  Shoulders and elbows were in good range of motion.  She had limited extension of her left wrist joint due to previous fracture.  She had left fourth PIP contracture without synovitis.  Hip joints and knee joints in good range of motion without any warmth swelling or effusion.  There was no tenderness over ankles or MTPs..  CDAI Exam: CDAI Score: -- Patient Global: --; Provider Global: -- Swollen: --; Tender: -- Joint Exam 04/17/2023   No joint exam has been documented for this visit   There is currently no information documented on the homunculus. Go to the Rheumatology activity and complete the homunculus joint exam.  Investigation: No additional findings.  Imaging: No results found.  Recent Labs: Lab Results  Component Value Date   WBC 3.9 01/18/2023   HGB 11.8 01/18/2023   PLT 294 01/18/2023   NA 141 01/18/2023   K 4.3 01/18/2023   CL 104 01/18/2023   CO2 24 01/18/2023   GLUCOSE 88 01/18/2023   BUN 20 01/18/2023   CREATININE 0.96 01/18/2023   BILITOT 0.3 01/18/2023   ALKPHOS 131 (H) 01/18/2023   AST 27 01/18/2023   ALT 17 01/18/2023   PROT 7.1 01/18/2023   ALBUMIN 4.3 01/18/2023   CALCIUM 9.0 01/18/2023   January 26, 2023 urine  protein creatinine ratio normal, ANA 1: 80 NH, 1: 1280 NS, RNP 2.7, SSA> 8.0, SSB negative, dsDNA negative, Smith negative, SCL 70 negative, C3-C4 normal, anticardiolipin negative, beta-2 GP 1 negative, ESR 28, CK164, RF 26   12/14: ANA 1:160, RNP 1.5, Ro>8 10/11/15: ANA 1:320 speckled, RNP 1.3, Ro>8 11/28/18: ANA 1:640 homogeneous, 1:640 speckled, CRP 6, CPK 170, ESR 29, Lyme negative  05/20/19: ANA 1:320 homogeneous speckled   Speciality Comments: No specialty comments available.  Procedures:  No procedures performed Allergies: Patient has no known allergies.   Assessment / Plan:     Visit Diagnoses: Mixed connective tissue disease (HCC) - Dx at [place] in [place]. +ANA 1:320homogenous speckled, joint swelling, photosensitivity, +RNP 1.3, +Ro>8, dry eyes, oral ulcers: January 26, 2023  urine protein creatinine ratio normal, ANA 1: 80 NH, 1: 1280 NS, RNP 2.7, SSA> 8.0, SSB negative, dsDNA negative, Smith negative, SCL 70 negative, C3-C4 normal, anticardiolipin negative, beta-2 GP 1 negative, ESR 28, CK164, RF 26.  Lab results were discussed with the patient.  Patient denies any history of oral ulcers, nasal ulcers, malar rash,  Raynaud's, inflammatory arthritis or lymphadenopathy.  She gives history of photosensitivity during the summer months.  She denies any shortness of breath or palpitations.  Increased association of mixed connective tissue disease with ILD and pulmonary hypertension was discussed.  Patient is currently asymptomatic.  I will have her establish with pulmonologist and cardiologist for baseline examination.  Long-term corticosteroid use - Prednisone 5 mg twice daily.  She has been off prednisone for the last 9 months.  She had frequent use of prednisone since 2013 for rash.  Primary osteoarthritis of both hands -she is currently asymptomatic.  Left wrist had limited extension due to previous fracture.  Contracture of the left fourth PIP joint noted.  Primary osteoarthritis of both  feet-she denies any pain or discomfort.  Primary hypertension-Blood pressure was mildly elevated.  Patient was advised to monitor blood pressure closely and follow-up with her PCP.  Stasis dermatitis of both legs  Spider varicose veins  History of hypothyroidism  Orders: Orders Placed This Encounter  Procedures   Ambulatory referral to Pulmonology   Ambulatory referral to Cardiology   No orders of the defined types were placed in this encounter.   Follow-Up Instructions: Return in about 6 months (around 10/16/2023) for Sjogren's, Osteoarthritis.   Pollyann Savoy, MD  Note - This record has been created using Animal nutritionist.  Chart creation errors have been sought, but may not always  have been located. Such creation errors do not reflect on  the standard of medical care.

## 2023-04-06 ENCOUNTER — Other Ambulatory Visit: Payer: Self-pay | Admitting: Family Medicine

## 2023-04-06 DIAGNOSIS — R12 Heartburn: Secondary | ICD-10-CM

## 2023-04-17 ENCOUNTER — Ambulatory Visit: Payer: No Typology Code available for payment source | Attending: Rheumatology | Admitting: Rheumatology

## 2023-04-17 ENCOUNTER — Encounter: Payer: Self-pay | Admitting: Rheumatology

## 2023-04-17 VITALS — BP 144/77 | HR 61 | Resp 15 | Ht 67.5 in | Wt 152.8 lb

## 2023-04-17 DIAGNOSIS — Z7952 Long term (current) use of systemic steroids: Secondary | ICD-10-CM | POA: Diagnosis not present

## 2023-04-17 DIAGNOSIS — Z8639 Personal history of other endocrine, nutritional and metabolic disease: Secondary | ICD-10-CM

## 2023-04-17 DIAGNOSIS — M19041 Primary osteoarthritis, right hand: Secondary | ICD-10-CM

## 2023-04-17 DIAGNOSIS — I872 Venous insufficiency (chronic) (peripheral): Secondary | ICD-10-CM

## 2023-04-17 DIAGNOSIS — I10 Essential (primary) hypertension: Secondary | ICD-10-CM

## 2023-04-17 DIAGNOSIS — I868 Varicose veins of other specified sites: Secondary | ICD-10-CM | POA: Diagnosis not present

## 2023-04-17 DIAGNOSIS — M19071 Primary osteoarthritis, right ankle and foot: Secondary | ICD-10-CM | POA: Diagnosis not present

## 2023-04-17 DIAGNOSIS — M351 Other overlap syndromes: Secondary | ICD-10-CM

## 2023-04-17 DIAGNOSIS — M19072 Primary osteoarthritis, left ankle and foot: Secondary | ICD-10-CM | POA: Diagnosis not present

## 2023-04-17 DIAGNOSIS — M3509 Sicca syndrome with other organ involvement: Secondary | ICD-10-CM

## 2023-04-17 DIAGNOSIS — M19042 Primary osteoarthritis, left hand: Secondary | ICD-10-CM | POA: Diagnosis not present

## 2023-04-27 DIAGNOSIS — E038 Other specified hypothyroidism: Secondary | ICD-10-CM | POA: Diagnosis not present

## 2023-04-28 LAB — TSH+FREE T4
Free T4: 1.32 ng/dL (ref 0.82–1.77)
TSH: 5.76 u[IU]/mL — ABNORMAL HIGH (ref 0.450–4.500)

## 2023-06-12 NOTE — Progress Notes (Signed)
 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 [facility]  Chief Complaint  Patient presents with   Consult    Discuss abnormal test that she has had done    Dyspnea:  very active at [facility] center but no aerobics Cough: none  Sleep: bed is flat with one pillow  SABA use: none  02: none     No obvious day to day or daytime pattern/variability or assoc excess/ purulent sputum or mucus plugs or hemoptysis or cp or chest tightness, subjective wheeze or overt sinus or hb symptoms.    Also denies any obvious fluctuation of symptoms with weather or environmental changes or other aggravating or alleviating factors except as outlined above   No unusual exposure hx or h/o childhood pna/ asthma or knowledge of premature birth.  Current Allergies, Complete Past Medical History, Past Surgical History, Family History, and Social History were reviewed in [employer] record.  ROS  The following are not active complaints unless bolded Hoarseness, sore throat, dysphagia, dental problems, itching, sneezing,  nasal congestion or discharge of excess mucus or purulent secretions, ear ache,   fever, chills, sweats, unintended wt loss or wt gain, classically pleuritic or exertional cp,  orthopnea pnd or arm/hand swelling  or leg swelling, presyncope, palpitations, abdominal pain, anorexia, nausea, vomiting, diarrhea  or change in bowel habits or change in bladder habits, change in stools or change in urine, dysuria, hematuria,  rash/improved, arthralgias, visual complaints, headache, numbness, weakness or ataxia or problems with walking or  coordination,  change in mood or  memory.            Outpatient Medications Prior to Visit  Medication Sig Dispense Refill   Calcium Carbonate-Vitamin D  (CALTRATE 600+D PO) Take by mouth daily.     levothyroxine  (SYNTHROID ) 100 MCG tablet Take 1 tablet (100 mcg total) by mouth daily. 90 tablet 1   Multiple Vitamin (MULTIVITAMIN ADULT PO) Take by mouth. daily     mupirocin  ointment (BACTROBAN ) 2 % Apply 1 Application topically 2 (two) times daily. 30 g 4   omeprazole  (PRILOSEC) 40 MG capsule TAKE 1 CAPSULE(40 MG) BY MOUTH DAILY 90 capsule 1   triamcinolone cream (KENALOG) 0.1 % Apply 1 Application topically 2 (two) times daily.     losartan  (COZAAR ) 50 MG tablet Take 1 tablet (50 mg total) by mouth daily. 90 tablet 1   No facility-administered medications prior to visit.    Past Medical History:  Diagnosis Date   Sjogren's syndrome (HCC)    Spider varicose veins       Objective:     BP (!) 154/74   Pulse 68   Ht 5' 7.5 (1.715 m)   Wt 153 lb 9.6 oz (69.7 kg)   SpO2 99% Comment: room air  BMI 23.70 kg/m   SpO2: 99 % (room air)  Amb wf nad    HEENT : Oropharynx  clear      Nasal turbinates nl    NECK :  without  apparent JVD/ palpable Nodes/TM    LUNGS: no acc muscle use,  Nl contour chest which is clear to A and P bilaterally without cough on insp or exp maneuvers   CV:  RRR  no s3 or murmur-  No increase in P2, and no edema   ABD:  soft and nontender   MS:  Gait nl   ext warm without deformities Or obvious joint restrictions  calf tenderness, cyanosis or clubbing    SKIN: warm and dry without lesions    NEURO:  alert, approp, nl sensorium with  no motor or cerebellar deficits apparent.   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 (Collect the dots to connect the dots approach)    F/u in 6 months sooner prn   Each maintenance medication was reviewed in detail including emphasizing most importantly the difference between maintenance and prns and under what circumstances the prns are to be triggered using an action plan format where appropriate.  Total time for H and P, chart review, counseling,  directly observing portions of ambulatory 02 saturation study/ and generating customized AVS unique to this office visit / same day charting = 32 min new pt eval                   Ozell America, MD 06/13/2023

## 2023-06-13 ENCOUNTER — Ambulatory Visit: Payer: No Typology Code available for payment source | Admitting: Internal Medicine

## 2023-06-13 ENCOUNTER — Encounter: Payer: Self-pay | Admitting: Internal Medicine

## 2023-06-13 VITALS — BP 154/74 | HR 68 | Ht 67.5 in | Wt 153.6 lb

## 2023-06-13 DIAGNOSIS — M3500 Sicca syndrome, unspecified: Secondary | ICD-10-CM

## 2023-06-13 NOTE — Patient Instructions (Addendum)
 We will you check your 02 saturation with rapid walking today   Make sure you check your oxygen saturation at your highest level of activity(NOT after you stop)  to be sure it stays over 90% and keep track of it at least once a week, more often if breathing getting worse, and let me know if losing ground. (Collect the dots to connect the dots approach)    Please remember to go to the  x-ray department  @  [facility] for your tests - we will call you with the results when they are available      Please schedule a follow up visit in 6 months but call sooner if needed with PFTs in the meantime.

## 2023-06-13 NOTE — Assessment & Plan Note (Addendum)
 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 (Collect the dots to connect the dots approach)    F/u in 6 months sooner prn   Each maintenance medication was reviewed in detail including emphasizing most importantly the difference between maintenance and prns and under what circumstances the prns are to be triggered using an action plan format where appropriate.  Total time for H and P, chart review, counseling,  directly observing portions of ambulatory 02 saturation study/ and generating customized AVS unique to this office visit / same day charting = 32 min new pt eval

## 2023-06-19 ENCOUNTER — Ambulatory Visit: Payer: Self-pay | Admitting: Family Medicine

## 2023-06-21 ENCOUNTER — Encounter: Payer: Self-pay | Admitting: Family Medicine

## 2023-06-21 ENCOUNTER — Ambulatory Visit (INDEPENDENT_AMBULATORY_CARE_PROVIDER_SITE_OTHER): Payer: No Typology Code available for payment source | Admitting: Family Medicine

## 2023-06-21 ENCOUNTER — Ambulatory Visit: Payer: No Typology Code available for payment source | Admitting: Family Medicine

## 2023-06-21 ENCOUNTER — Other Ambulatory Visit: Payer: Self-pay | Admitting: Family Medicine

## 2023-06-21 VITALS — BP 148/94 | HR 93 | Resp 16 | Ht 67.5 in | Wt 152.0 lb

## 2023-06-21 DIAGNOSIS — L309 Dermatitis, unspecified: Secondary | ICD-10-CM

## 2023-06-21 DIAGNOSIS — R002 Palpitations: Secondary | ICD-10-CM | POA: Diagnosis not present

## 2023-06-21 DIAGNOSIS — Z1231 Encounter for screening mammogram for malignant neoplasm of breast: Secondary | ICD-10-CM

## 2023-06-21 DIAGNOSIS — B009 Herpesviral infection, unspecified: Secondary | ICD-10-CM

## 2023-06-21 DIAGNOSIS — I10 Essential (primary) hypertension: Secondary | ICD-10-CM

## 2023-06-21 DIAGNOSIS — E7849 Other hyperlipidemia: Secondary | ICD-10-CM | POA: Diagnosis not present

## 2023-06-21 DIAGNOSIS — E559 Vitamin D deficiency, unspecified: Secondary | ICD-10-CM

## 2023-06-21 DIAGNOSIS — A6000 Herpesviral infection of urogenital system, unspecified: Secondary | ICD-10-CM

## 2023-06-21 DIAGNOSIS — Z1211 Encounter for screening for malignant neoplasm of colon: Secondary | ICD-10-CM

## 2023-06-21 DIAGNOSIS — R7301 Impaired fasting glucose: Secondary | ICD-10-CM

## 2023-06-21 DIAGNOSIS — E038 Other specified hypothyroidism: Secondary | ICD-10-CM | POA: Diagnosis not present

## 2023-06-21 DIAGNOSIS — L03115 Cellulitis of right lower limb: Secondary | ICD-10-CM

## 2023-06-21 MED ORDER — VALACYCLOVIR HCL 1 G PO TABS
ORAL_TABLET | ORAL | 0 refills | Status: AC
Start: 2023-06-21 — End: ?

## 2023-06-21 MED ORDER — LOSARTAN POTASSIUM 100 MG PO TABS: 100.0000 mg | ORAL_TABLET | Freq: Every day | ORAL | 1 refills | Status: DC

## 2023-06-21 MED ORDER — MUPIROCIN 2 % EX OINT: 1.0000 | TOPICAL_OINTMENT | Freq: Two times a day (BID) | CUTANEOUS | 6 refills | Status: AC

## 2023-06-21 NOTE — Progress Notes (Unsigned)
Established Patient Office Visit  Subjective:  Patient ID: Kimberly Singh, female    DOB: [birth year]  Age: 68 y.o. MRN: [id number]  CC:  Chief Complaint  Patient presents with   Hypertension    Follow up    Palpitations    States she has been having palpitations and sob sometimes and her rheumatolgist wanted a baseline EKG and chest xray     HPI Kimberly Singh is a 68 y.o. female with past medical history of hypertension, and HSV-2 presents for f/u of  chronic medical conditions.   Hypertension: The patient is currently asymptomatic in the clinic and reports compliance with Losartan 50 mg daily. She adheres to a low-sodium diet and has increased physical activity.  Palpitations:The patient reports ongoing palpitations for a few months, occurring at rest with shortness of breath. She denies chest tightness, lightheadedness, diaphoresis, chest pressure, or difficulty breathing. She followed up on April 17, 2023, and informed her rheumatologist, who placed a referral to cardiology; however, she has not received any updates on the referral. She has a chest X-ray ordered by her pulmonologist, which she plans to complete today.  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   Past Medical History:  Diagnosis Date   Sjogren's syndrome (HCC)    Spider varicose veins     Past Surgical History:  Procedure Laterality Date   CATARACT EXTRACTION, BILATERAL     CHOLECYSTECTOMY     KNEE ARTHROSCOPY W/ MENISCAL REPAIR Right    TUBAL LIGATION     age 35   WRIST FRACTURE SURGERY Left     Family History  Problem Relation Age of Onset   Obesity Mother    Heart attack Mother    Hypertension Mother    Prostate cancer Father    Obesity Brother    COPD Brother    Alcoholism Brother    Healthy Son    Healthy Son     Social History   Socioeconomic History   Marital status: Married    Spouse name: Not on file   Number of children: Not  on file   Years of education: Not on file   Highest education level: Some college, no degree  Occupational History   Not on file  Tobacco Use   Smoking status: Never    Passive exposure: Never   Smokeless tobacco: Never  Vaping Use   Vaping status: Never Used  Substance and Sexual Activity   Alcohol use: Yes    Comment: occ   Drug use: Never   Sexual activity: Yes    Partners: Male  Other Topics Concern   Not on file  Social History Narrative   Not on file   Social Drivers of Health   Financial Resource Strain: Low Risk  (06/20/2023)   Overall Financial Resource Strain (CARDIA)    Difficulty of Paying Living Expenses: Not very hard  Food Insecurity: No Food Insecurity (06/20/2023)   Hunger Vital Sign    Worried About Running Out of Food in the Last Year: Never true    Ran Out of Food in the Last Year: Never true  Transportation Needs: No Transportation Needs (06/20/2023)   PRAPARE - Administrator, Civil Service (Medical): No    Lack of Transportation (Non-Medical): No  Physical Activity: Sufficiently Active (06/20/2023)   Exercise Vital Sign    Days of Exercise per Week: 5 days    Minutes of Exercise per Session: 60 min  Stress: No Stress Concern Present (06/20/2023)   [employer] of Occupational Health - Occupational Stress Questionnaire    Feeling of Stress : Not at all  Social Connections: Moderately Integrated (06/20/2023)   Social Connection and Isolation Panel [NHANES]    Frequency of Communication with Friends and Family: More than three times a week    Frequency of Social Gatherings with Friends and Family: Once a week    Attends Religious Services: More than 4 times per year    Active Member of [employer] or Organizations: No    Attends Banker Meetings: Not on file    Marital Status: Married  Catering manager Violence: Not At Risk (01/20/2022)   Humiliation, Afraid, Rape, and Kick questionnaire    Fear of Current or Ex-Partner: No     Emotionally Abused: No    Physically Abused: No    Sexually Abused: No    Outpatient Medications Prior to Visit  Medication Sig Dispense Refill   Calcium Carbonate-Vitamin D (CALTRATE 600+D PO) Take by mouth daily.     levothyroxine (SYNTHROID) 100 MCG tablet Take 1 tablet (100 mcg total) by mouth daily. 90 tablet 1   Multiple Vitamin (MULTIVITAMIN ADULT PO) Take by mouth. daily     omeprazole (PRILOSEC) 40 MG capsule TAKE 1 CAPSULE(40 MG) BY MOUTH DAILY 90 capsule 1   triamcinolone cream (KENALOG) 0.1 % Apply 1 Application topically 2 (two) times daily.     mupirocin ointment (BACTROBAN) 2 % Apply 1 Application topically 2 (two) times daily. 30 g 4   losartan (COZAAR) 50 MG tablet Take 1 tablet (50 mg total) by mouth daily. 90 tablet 1   No facility-administered medications prior to visit.    No Known Allergies  ROS Review of Systems  Constitutional:  Negative for chills and fever.  Eyes:  Negative for visual disturbance.  Respiratory:  Negative for chest tightness and shortness of breath.   Cardiovascular:  Positive for palpitations.  Neurological:  Negative for dizziness and headaches.      Objective:    Physical Exam HENT:     Head: Normocephalic.     Mouth/Throat:     Mouth: Mucous membranes are moist.  Cardiovascular:     Rate and Rhythm: Normal rate.     Heart sounds: Normal heart sounds.  Pulmonary:     Effort: Pulmonary effort is normal.     Breath sounds: Normal breath sounds.  Neurological:     Mental Status: She is alert.     BP (!) 148/94   Pulse 93   Resp 16   Ht 5' 7.5" (1.715 m)   Wt 152 lb (68.9 kg)   SpO2 98%   BMI 23.46 kg/m  Wt Readings from Last 3 Encounters:  06/21/23 152 lb (68.9 kg)  06/13/23 153 lb 9.6 oz (69.7 kg)  04/17/23 152 lb 12.8 oz (69.3 kg)    Lab Results  Component Value Date   TSH 5.760 (H) 04/27/2023   Lab Results  Component Value Date   WBC 3.9 01/18/2023   HGB 11.8 01/18/2023   HCT 34.9 01/18/2023   MCV 91  01/18/2023   PLT 294 01/18/2023   Lab Results  Component Value Date   NA 141 01/18/2023   K 4.3 01/18/2023   CO2 24 01/18/2023   GLUCOSE 88 01/18/2023   BUN 20 01/18/2023   CREATININE 0.96 01/18/2023   BILITOT 0.3 01/18/2023   ALKPHOS 131 (H) 01/18/2023   AST 27 01/18/2023   ALT  17 01/18/2023   PROT 7.1 01/18/2023   ALBUMIN 4.3 01/18/2023   CALCIUM 9.0 01/18/2023   EGFR 64 01/18/2023   Lab Results  Component Value Date   CHOL 174 01/18/2023   Lab Results  Component Value Date   HDL 60 01/18/2023   Lab Results  Component Value Date   LDLCALC 99 01/18/2023   Lab Results  Component Value Date   TRIG 79 01/18/2023   Lab Results  Component Value Date   CHOLHDL 2.9 01/18/2023   Lab Results  Component Value Date   HGBA1C 5.7 (H) 01/18/2023      Assessment & Plan:  Primary hypertension Assessment & Plan: The patient's blood pressure is uncontrolled today in the clinic. Losartan will be increased to 100 mg daily, and the patient is encouraged to follow up in four weeks to reassess blood pressure control. A low-sodium diet of less than 2,300 mg daily is recommended, along with moderate-intensity physical activity for at least 150 minutes per week. The patient is encouraged to continue these lifestyle modifications to help manage blood pressure effectively. Long-term considerations were discussed, emphasizing that uncontrolled hypertension increases the risk of stroke, coronary artery disease, and heart failure. The patient was advised to seek emergency care if blood pressure exceeds 180/120 and is accompanied by symptoms such as severe headaches, chest pain, palpitations, blurred vision, or dizziness. The patient verbalized understanding and will follow up as recommended. BP Readings from Last 3 Encounters:  06/21/23 (!) 148/94  06/13/23 (!) 154/74  04/17/23 (!) 144/77      Orders: -     Losartan Potassium; Take 1 tablet (100 mg total) by mouth daily.  Dispense: 30  tablet; Refill: 1  Palpitations Assessment & Plan: The EKG in the clinic showed normal sinus rhythm, and the patient is currently asymptomatic, with no evidence of palpitations at this time. A referral to cardiology will be placed for further evaluation. The patient verbalized understanding and is aware of the plan of care. The patient is encouraged to seek urgent medical attention if experiencing palpitations lasting longer than a few minutes or occurring frequently, a heart rate consistently over 100 BPM (tachycardia) or below 50 BPM (bradycardia), a persistent fluttering sensation in the chest, or palpitations occurring with a history of heart disease, high blood pressure, or stroke. The patient will follow up as scheduled for further assessment and management.   Orders: -     EKG 12-Lead -     Ambulatory referral to Cardiology  HSV-2 infection Assessment & Plan: Refill sent to the pharmacy  Orders: -     valACYclovir HCl; Take 1 tablet (1000 mg total) by mouth twice daily (BID) for 10 days during outbreaks.  Dispense: 60 tablet; Refill: 0  Dermatitis -     Mupirocin; Apply 1 Application topically 2 (two) times daily.  Dispense: 30 g; Refill: 6  IFG (impaired fasting glucose) -     Hemoglobin A1c  Vitamin D deficiency -     VITAMIN D 25 Hydroxy (Vit-D Deficiency, Fractures)  TSH (thyroid-stimulating hormone deficiency) -     TSH + free T4  Other hyperlipidemia -     Lipid panel -     CMP14+EGFR -     CBC with Differential/Platelet  Breast cancer screening by mammogram -     3D Screening Mammogram, Left and Right  Colon cancer screening -     Cologuard  Note: This chart has been completed using Engineer, civil (consulting) software, and while attempts  have been made to ensure accuracy, certain words and phrases may not be transcribed as intended.    Follow-up: Return in about 1 month (around 07/19/2023) for BP.   Gilmore Laroche, FNP

## 2023-06-21 NOTE — Patient Instructions (Addendum)
I appreciate the opportunity to provide care to you today!    Follow up:  1 month for BP  Labs: please stop by the lab during the week to get your blood drawn (CBC, CMP, TSH, Lipid profile, HgA1c, Vit D)  Schedule medicare annual wellness visit Schedule mammogram  Signs to Seek Urgent Care for Palpitations  Seek urgent medical attention if you experience palpitations along with any of the following symptoms:  Serious Symptoms Indicating a Possible Cardiac Emergency: Chest pain or pressure (may indicate a heart attack) Shortness of breath or difficulty breathing Dizziness, lightheadedness, or fainting Severe fatigue or weakness Irregular, fast, or very slow heartbeat (especially if persistent) Sweating or cold, clammy skin Pain or discomfort in the jaw, neck, shoulder, arm, or back Sudden onset of confusion or difficulty speaking Other Symptoms Requiring Medical Evaluation: Palpitations lasting longer than a few minutes or occurring frequently Heart rate consistently over 100 BPM (tachycardia) or below 50 BPM (bradycardia) Fluttering sensation in the chest that does not go away Palpitations occurring with a history of heart disease, high blood pressure, or stroke   Hypertension Management  Your current blood pressure is above the target goal of <140/90 mmHg. To address this, please start taking losartan 100 mg daily  Medication Instructions: Take your blood pressure medication at the same time each day. After taking your medication, check your blood pressure at least an hour later. If your first reading is >140/90 mmHg, wait at least 10 minutes and recheck your blood pressure. Side Effects: In the initial days of therapy, you may experience dizziness or lightheadedness as your body adjusts to the lower blood pressure; this is expected. Diet and Lifestyle: Adhere to a low-sodium diet, limiting intake to less than 1500 mg daily, and increase your physical activity. Avoid  over-the-counter NSAIDs such as ibuprofen and naproxen while on this medication. Hydration and Nutrition: Stay well-hydrated by drinking at least 64 ounces of water daily. Increase your servings of fruits and vegetables and avoid excessive sodium in your diet. Long-Term Considerations: Uncontrolled hypertension can increase the risk of cardiovascular diseases, including stroke, coronary artery disease, and heart failure.  Please report to the emergency department if your blood pressure exceeds 180/120 and is accompanied by symptoms such as headaches, chest pain, palpitations, blurred vision, or dizziness.    Referrals today- Cardiology  Attached with your AVS, you will find valuable resources for self-education. I highly recommend dedicating some time to thoroughly examine them.   Please continue to a heart-healthy diet and increase your physical activities. Try to exercise for at least five days a week.    It was a pleasure to see you and I look forward to continuing to work together on your health and well-being. Please do not hesitate to call the office if you need care or have questions about your care.  In case of emergency, please visit the Emergency Department for urgent care, or contact our clinic at [phone] to schedule an appointment. We're here to help you!   Have a wonderful day and week. With Gratitude, Gilmore Laroche MSN, FNP-BC

## 2023-06-22 DIAGNOSIS — R002 Palpitations: Secondary | ICD-10-CM | POA: Insufficient documentation

## 2023-06-22 NOTE — Assessment & Plan Note (Signed)
Refill sent to the pharmacy

## 2023-06-22 NOTE — Assessment & Plan Note (Signed)
The EKG in the clinic showed normal sinus rhythm, and the patient is currently asymptomatic, with no evidence of palpitations at this time. A referral to cardiology will be placed for further evaluation. The patient verbalized understanding and is aware of the plan of care. The patient is encouraged to seek urgent medical attention if experiencing palpitations lasting longer than a few minutes or occurring frequently, a heart rate consistently over 100 BPM (tachycardia) or below 50 BPM (bradycardia), a persistent fluttering sensation in the chest, or palpitations occurring with a history of heart disease, high blood pressure, or stroke. The patient will follow up as scheduled for further assessment and management.

## 2023-06-22 NOTE — Assessment & Plan Note (Signed)
The patient's blood pressure is uncontrolled today in the clinic. Losartan will be increased to 100 mg daily, and the patient is encouraged to follow up in four weeks to reassess blood pressure control. A low-sodium diet of less than 2,300 mg daily is recommended, along with moderate-intensity physical activity for at least 150 minutes per week. The patient is encouraged to continue these lifestyle modifications to help manage blood pressure effectively. Long-term considerations were discussed, emphasizing that uncontrolled hypertension increases the risk of stroke, coronary artery disease, and heart failure. The patient was advised to seek emergency care if blood pressure exceeds 180/120 and is accompanied by symptoms such as severe headaches, chest pain, palpitations, blurred vision, or dizziness. The patient verbalized understanding and will follow up as recommended. BP Readings from Last 3 Encounters:  06/21/23 (!) 148/94  06/13/23 (!) 154/74  04/17/23 (!) 144/77

## 2023-07-02 ENCOUNTER — Encounter: Payer: Self-pay | Admitting: Family Medicine

## 2023-07-13 ENCOUNTER — Other Ambulatory Visit: Payer: Self-pay

## 2023-07-13 ENCOUNTER — Ambulatory Visit (HOSPITAL_COMMUNITY)
Admission: RE | Admit: 2023-07-13 | Discharge: 2023-07-13 | Disposition: A | Discharge status: Home / Self Care | Source: Ambulatory Visit | Attending: Internal Medicine | Admitting: Internal Medicine

## 2023-07-13 DIAGNOSIS — M3500 Sicca syndrome, unspecified: Secondary | ICD-10-CM | POA: Insufficient documentation

## 2023-07-13 DIAGNOSIS — M359 Systemic involvement of connective tissue, unspecified: Secondary | ICD-10-CM | POA: Diagnosis not present

## 2023-07-13 DIAGNOSIS — E038 Other specified hypothyroidism: Secondary | ICD-10-CM

## 2023-07-13 MED ORDER — LEVOTHYROXINE SODIUM 100 MCG PO TABS: 100.0000 ug | ORAL_TABLET | Freq: Every day | ORAL | 1 refills | Status: DC

## 2023-07-13 NOTE — Telephone Encounter (Signed)
 Refills sent to pharmacy.

## 2023-07-17 ENCOUNTER — Other Ambulatory Visit: Payer: Self-pay

## 2023-07-27 DIAGNOSIS — E038 Other specified hypothyroidism: Secondary | ICD-10-CM | POA: Diagnosis not present

## 2023-07-27 DIAGNOSIS — E7849 Other hyperlipidemia: Secondary | ICD-10-CM | POA: Diagnosis not present

## 2023-07-27 DIAGNOSIS — R7301 Impaired fasting glucose: Secondary | ICD-10-CM | POA: Diagnosis not present

## 2023-07-27 DIAGNOSIS — E559 Vitamin D deficiency, unspecified: Secondary | ICD-10-CM | POA: Diagnosis not present

## 2023-07-28 ENCOUNTER — Encounter: Payer: Self-pay | Admitting: Family Medicine

## 2023-07-28 LAB — CMP14+EGFR
ALT: 22 IU/L (ref 0–32)
AST: 28 IU/L (ref 0–40)
Albumin: 4 g/dL (ref 3.9–4.9)
Alkaline Phosphatase: 139 IU/L — ABNORMAL HIGH (ref 44–121)
BUN/Creatinine Ratio: 20 (ref 12–28)
BUN: 20 mg/dL (ref 8–27)
Bilirubin Total: 0.2 mg/dL (ref 0.0–1.2)
CO2: 22 mmol/L (ref 20–29)
Calcium: 9.4 mg/dL (ref 8.7–10.3)
Chloride: 105 mmol/L (ref 96–106)
Creatinine, Ser: 1 mg/dL (ref 0.57–1.00)
Globulin, Total: 3.1 g/dL (ref 1.5–4.5)
Glucose: 84 mg/dL (ref 70–99)
Potassium: 4.6 mmol/L (ref 3.5–5.2)
Sodium: 143 mmol/L (ref 134–144)
Total Protein: 7.1 g/dL (ref 6.0–8.5)
eGFR: 61 mL/min/{1.73_m2} (ref >59)

## 2023-07-28 LAB — CBC WITH DIFFERENTIAL/PLATELET
Basophils Absolute: 0 10*3/uL (ref 0.0–0.2)
Basos: 1 %
EOS (ABSOLUTE): 0.1 10*3/uL (ref 0.0–0.4)
Eos: 2 %
Hematocrit: 35.9 % (ref 34.0–46.6)
Hemoglobin: 12.2 g/dL (ref 11.1–15.9)
Immature Grans (Abs): 0 10*3/uL (ref 0.0–0.1)
Immature Granulocytes: 0 %
Lymphocytes Absolute: 1.7 10*3/uL (ref 0.7–3.1)
Lymphs: 41 %
MCH: 30.7 pg (ref 26.6–33.0)
MCHC: 34 g/dL (ref 31.5–35.7)
MCV: 90 fL (ref 79–97)
Monocytes Absolute: 0.4 10*3/uL (ref 0.1–0.9)
Monocytes: 10 %
Neutrophils Absolute: 1.9 10*3/uL (ref 1.4–7.0)
Neutrophils: 46 %
Platelets: 290 10*3/uL (ref 150–450)
RBC: 3.97 x10E6/uL (ref 3.77–5.28)
RDW: 13.1 % (ref 11.7–15.4)
WBC: 4.2 10*3/uL (ref 3.4–10.8)

## 2023-07-28 LAB — LIPID PANEL
Chol/HDL Ratio: 2.9 ratio (ref 0.0–4.4)
Cholesterol, Total: 173 mg/dL (ref 100–199)
HDL: 60 mg/dL (ref >39)
LDL Chol Calc (NIH): 99 mg/dL (ref 0–99)
Triglycerides: 77 mg/dL (ref 0–149)
VLDL Cholesterol Cal: 14 mg/dL (ref 5–40)

## 2023-07-28 LAB — TSH+FREE T4
Free T4: 1.26 ng/dL (ref 0.82–1.77)
TSH: 5.31 u[IU]/mL — ABNORMAL HIGH (ref 0.450–4.500)

## 2023-07-28 LAB — HEMOGLOBIN A1C
Est. average glucose Bld gHb Est-mCnc: 117 mg/dL
Hgb A1c MFr Bld: 5.7 % — ABNORMAL HIGH (ref 4.8–5.6)

## 2023-07-28 LAB — VITAMIN D 25 HYDROXY (VIT D DEFICIENCY, FRACTURES): Vit D, 25-Hydroxy: 48.8 ng/mL (ref 30.0–100.0)

## 2023-08-02 ENCOUNTER — Other Ambulatory Visit: Payer: Self-pay | Admitting: Family Medicine

## 2023-08-02 ENCOUNTER — Ambulatory Visit (INDEPENDENT_AMBULATORY_CARE_PROVIDER_SITE_OTHER): Payer: No Typology Code available for payment source | Admitting: Family Medicine

## 2023-08-02 ENCOUNTER — Encounter: Payer: Self-pay | Admitting: Family Medicine

## 2023-08-02 VITALS — BP 154/74 | HR 69 | Ht 67.0 in | Wt 151.1 lb

## 2023-08-02 DIAGNOSIS — I10 Essential (primary) hypertension: Secondary | ICD-10-CM | POA: Diagnosis not present

## 2023-08-02 MED ORDER — AMLODIPINE-OLMESARTAN 5-20 MG PO TABS: 1.0000 | ORAL_TABLET | Freq: Every day | ORAL | 1 refills | Status: DC

## 2023-08-02 NOTE — Progress Notes (Signed)
 Established Patient Office Visit  Subjective:  Patient ID: Kimberly Singh, female    DOB: Nov 05, 1955  Age: 68 y.o. MRN: [id number]  CC:  Chief Complaint  Patient presents with   Care Management    Pt states her blood pressure is really high all the time, Also here for a 1 month Follow-up    HPI Kimberly Singh is a 68 y.o. female presents for hypertension f/u. For the details of today's visit, please refer to the assessment and plan.    Past Medical History:  Diagnosis Date   Sjogren's syndrome (HCC)    Spider varicose veins     Past Surgical History:  Procedure Laterality Date   CATARACT EXTRACTION, BILATERAL     CHOLECYSTECTOMY     KNEE ARTHROSCOPY W/ MENISCAL REPAIR Right    TUBAL LIGATION     age 56   WRIST FRACTURE SURGERY Left     Family History  Problem Relation Age of Onset   Obesity Mother    Heart attack Mother    Hypertension Mother    Prostate cancer Father    Obesity Brother    COPD Brother    Alcoholism Brother    Healthy Son    Healthy Son     Social History   Socioeconomic History   Marital status: Married    Spouse name: Not on file   Number of children: Not on file   Years of education: Not on file   Highest education level: Some college, no degree  Occupational History   Not on file  Tobacco Use   Smoking status: Never    Passive exposure: Never   Smokeless tobacco: Never  Vaping Use   Vaping status: Never Used  Substance and Sexual Activity   Alcohol use: Yes    Comment: occ   Drug use: Never   Sexual activity: Yes    Partners: Male  Other Topics Concern   Not on file  Social History Narrative   Not on file   Social Drivers of Health   Financial Resource Strain: Low Risk  (06/20/2023)   Overall Financial Resource Strain (CARDIA)    Difficulty of Paying Living Expenses: Not very hard  Food Insecurity: No Food Insecurity (06/20/2023)   Hunger Vital Sign    Worried About Running Out of Food in the Last Year: Never true    Ran  Out of Food in the Last Year: Never true  Transportation Needs: No Transportation Needs (06/20/2023)   PRAPARE - Administrator, Civil Service (Medical): No    Lack of Transportation (Non-Medical): No  Physical Activity: Sufficiently Active (06/20/2023)   Exercise Vital Sign    Days of Exercise per Week: 5 days    Minutes of Exercise per Session: 60 min  Stress: No Stress Concern Present (06/20/2023)   [employer] of Occupational Health - Occupational Stress Questionnaire    Feeling of Stress : Not at all  Social Connections: Moderately Integrated (06/20/2023)   Social Connection and Isolation Panel [NHANES]    Frequency of Communication with Friends and Family: More than three times a week    Frequency of Social Gatherings with Friends and Family: Once a week    Attends Religious Services: More than 4 times per year    Active Member of [employer] or Organizations: No    Attends Banker Meetings: Not on file    Marital Status: Married  Intimate Partner Violence: Not At Risk (01/20/2022)   Humiliation, Afraid,  Rape, and Kick questionnaire    Fear of Current or Ex-Partner: No    Emotionally Abused: No    Physically Abused: No    Sexually Abused: No    Outpatient Medications Prior to Visit  Medication Sig Dispense Refill   Calcium Carbonate-Vitamin D (CALTRATE 600+D PO) Take by mouth daily.     levothyroxine (SYNTHROID) 100 MCG tablet Take 1 tablet (100 mcg total) by mouth daily. 90 tablet 1   Multiple Vitamin (MULTIVITAMIN ADULT PO) Take by mouth. daily     mupirocin ointment (BACTROBAN) 2 % Apply 1 Application topically 2 (two) times daily. 30 g 6   omeprazole (PRILOSEC) 40 MG capsule TAKE 1 CAPSULE(40 MG) BY MOUTH DAILY 90 capsule 1   valACYclovir (VALTREX) 1000 MG tablet Take 1 tablet (1000 mg total) by mouth twice daily (BID) for 10 days during outbreaks. 60 tablet 0   losartan (COZAAR) 100 MG tablet Take 1 tablet (100 mg total) by mouth daily. 30 tablet 1    triamcinolone cream (KENALOG) 0.1 % Apply 1 Application topically 2 (two) times daily. (Patient not taking: Reported on 08/02/2023)     No facility-administered medications prior to visit.    No Known Allergies  ROS Review of Systems  Constitutional:  Negative for chills and fever.  Eyes:  Negative for visual disturbance.  Respiratory:  Negative for chest tightness and shortness of breath.   Neurological:  Negative for dizziness and headaches.      Objective:    Physical Exam HENT:     Head: Normocephalic.     Mouth/Throat:     Mouth: Mucous membranes are moist.  Cardiovascular:     Rate and Rhythm: Normal rate.     Heart sounds: Normal heart sounds.  Pulmonary:     Effort: Pulmonary effort is normal.     Breath sounds: Normal breath sounds.  Neurological:     Mental Status: She is alert.     BP (!) 154/74 (BP Location: Left Arm, Patient Position: Sitting, Cuff Size: Normal)   Pulse 69   Ht 5\' 7"  (1.702 m)   Wt 151 lb 1.9 oz (68.5 kg)   SpO2 97%   BMI 23.67 kg/m  Wt Readings from Last 3 Encounters:  08/02/23 151 lb 1.9 oz (68.5 kg)  06/21/23 152 lb (68.9 kg)  06/13/23 153 lb 9.6 oz (69.7 kg)    Lab Results  Component Value Date   TSH 5.310 (H) 07/27/2023   Lab Results  Component Value Date   WBC 4.2 07/27/2023   HGB 12.2 07/27/2023   HCT 35.9 07/27/2023   MCV 90 07/27/2023   PLT 290 07/27/2023   Lab Results  Component Value Date   NA 143 07/27/2023   K 4.6 07/27/2023   CO2 22 07/27/2023   GLUCOSE 84 07/27/2023   BUN 20 07/27/2023   CREATININE 1.00 07/27/2023   BILITOT 0.2 07/27/2023   ALKPHOS 139 (H) 07/27/2023   AST 28 07/27/2023   ALT 22 07/27/2023   PROT 7.1 07/27/2023   ALBUMIN 4.0 07/27/2023   CALCIUM 9.4 07/27/2023   EGFR 61 07/27/2023   Lab Results  Component Value Date   CHOL 173 07/27/2023   Lab Results  Component Value Date   HDL 60 07/27/2023   Lab Results  Component Value Date   LDLCALC 99 07/27/2023   Lab Results   Component Value Date   TRIG 77 07/27/2023   Lab Results  Component Value Date   CHOLHDL 2.9 07/27/2023  Lab Results  Component Value Date   HGBA1C 5.7 (H) 07/27/2023      Assessment & Plan:  Primary hypertension Assessment & Plan: The patient's blood pressures are stable today in the clinic. She reports compliance with Losartan 100 mg daily. She does report a mild headache, which is relieved with Tylenol. She also reports adherence to a low-sodium diet and increased physical activity. Losartan 100 mg daily will be discontinued, and therapy will be initiated with Olmesartan-Amlodipine 20/5 mg daily. The patient is advised to monitor for side effects. She is encouraged to check her blood pressure at home daily and bring her readings to her next visit. She is also encouraged to continue a low-sodium diet and maintain increased physical activity. Symptoms of hypotension were reviewed, particularly with the initiation of a new antihypertensive or dose titration. The patient is encouraged to stay well-hydrated while on the treatment regimen. Follow-up is scheduled in 1 month." BP Readings from Last 3 Encounters:  08/02/23 (!) 154/74  06/21/23 (!) 148/94  06/13/23 (!) 154/74      Note: This chart has been completed using [employer], and while attempts have been made to ensure accuracy, certain words and phrases may not be transcribed as intended.    Follow-up: Return in about 1 month (around 09/02/2023).   Gilmore Laroche, FNP

## 2023-08-02 NOTE — Patient Instructions (Addendum)
 I appreciate the opportunity to provide care to you today!    Follow up:  1 months  Hypertension Management  Your current blood pressure is above the target goal of <140/90 mmHg. To address this, please start taking Olmesartan-amlodipine 20-5 mg daily  Medication Instructions: Take your blood pressure medication at the same time each day. After taking your medication, check your blood pressure at least an hour later. If your first reading is >140/90 mmHg, wait at least 10 minutes and recheck your blood pressure. Side Effects: In the initial days of therapy, you may experience dizziness or lightheadedness as your body adjusts to the lower blood pressure; this is expected. Diet and Lifestyle: Adhere to a low-sodium diet, limiting intake to less than 1500 mg daily, and increase your physical activity. Avoid over-the-counter NSAIDs such as ibuprofen and naproxen while on this medication. Hydration and Nutrition: Stay well-hydrated by drinking at least 64 ounces of water daily. Increase your servings of fruits and vegetables and avoid excessive sodium in your diet. Long-Term Considerations: Uncontrolled hypertension can increase the risk of cardiovascular diseases, including stroke, coronary artery disease, and heart failure.  Please report to the emergency department if your blood pressure exceeds 180/120 and is accompanied by symptoms such as headaches, chest pain, palpitations, blurred vision, or dizziness.    Attached with your AVS, you will find valuable resources for self-education. I highly recommend dedicating some time to thoroughly examine them.   Please continue to a heart-healthy diet and increase your physical activities. Try to exercise for at least five days a week.    It was a pleasure to see you and I look forward to continuing to work together on your health and well-being. Please do not hesitate to call the office if you need care or have questions about your care.  In  case of emergency, please visit the Emergency Department for urgent care, or contact our clinic at [phone] to schedule an appointment. We're here to help you!   Have a wonderful day and week. With Gratitude, Gilmore Laroche MSN, FNP-BC

## 2023-08-02 NOTE — Assessment & Plan Note (Signed)
 The patient's blood pressures are stable today in the clinic. She reports compliance with Losartan 100 mg daily. She does report a mild headache, which is relieved with Tylenol. She also reports adherence to a low-sodium diet and increased physical activity. Losartan 100 mg daily will be discontinued, and therapy will be initiated with Olmesartan-Amlodipine 20/5 mg daily. The patient is advised to monitor for side effects. She is encouraged to check her blood pressure at home daily and bring her readings to her next visit. She is also encouraged to continue a low-sodium diet and maintain increased physical activity. Symptoms of hypotension were reviewed, particularly with the initiation of a new antihypertensive or dose titration. The patient is encouraged to stay well-hydrated while on the treatment regimen. Follow-up is scheduled in 1 month." BP Readings from Last 3 Encounters:  08/02/23 (!) 154/74  06/21/23 (!) 148/94  06/13/23 (!) 154/74

## 2023-08-08 ENCOUNTER — Ambulatory Visit: Payer: No Typology Code available for payment source | Attending: Internal Medicine | Admitting: Internal Medicine

## 2023-08-08 ENCOUNTER — Ambulatory Visit

## 2023-08-08 ENCOUNTER — Encounter: Payer: Self-pay | Admitting: Internal Medicine

## 2023-08-08 VITALS — BP 138/68 | HR 74 | Ht 67.0 in | Wt 151.0 lb

## 2023-08-08 DIAGNOSIS — R002 Palpitations: Secondary | ICD-10-CM

## 2023-08-08 DIAGNOSIS — R0602 Shortness of breath: Secondary | ICD-10-CM | POA: Diagnosis not present

## 2023-08-08 DIAGNOSIS — R0609 Other forms of dyspnea: Secondary | ICD-10-CM | POA: Diagnosis not present

## 2023-08-08 NOTE — Progress Notes (Signed)
 Cardiology Office Note  Date: 08/08/2023   ID: Kimberly Singh, DOB Sep 20, 1955, MRN [id number]  PCP:  Gilmore Laroche, FNP  Cardiologist:  Marjo Bicker, MD Electrophysiologist:  None   History of Present Illness: Kimberly Singh is a 68 y.o. female known to have Sjogren's syndrome, mixed connective tissue disease was referred to cardiology clinic for evaluation of palpitations.  Ongoing palpitations for almost 20 years.  Occurs 1-2 times per week and lasting between 15 and 20 seconds.  Associated with SOB and dizziness.  She also gets short of breath at rest and had to gasp for air.  No angina, syncope, leg swelling.  She drinks 2 cups of coffee in the morning and 1 cup of green tea in the evening.  She underwent event monitor when she was in [place] and that came back to be normal.  Past Medical History:  Diagnosis Date   Sjogren's syndrome (HCC)    Spider varicose veins     Past Surgical History:  Procedure Laterality Date   CATARACT EXTRACTION, BILATERAL     CHOLECYSTECTOMY     KNEE ARTHROSCOPY W/ MENISCAL REPAIR Right    TUBAL LIGATION     age 23   WRIST FRACTURE SURGERY Left     Current Outpatient Medications  Medication Sig Dispense Refill   amLODipine-olmesartan (AZOR) 5-20 MG tablet TAKE 1 TABLET BY MOUTH DAILY 90 tablet 0   Calcium Carbonate-Vitamin D (CALTRATE 600+D PO) Take by mouth daily.     levothyroxine (SYNTHROID) 100 MCG tablet Take 1 tablet (100 mcg total) by mouth daily. 90 tablet 1   Multiple Vitamin (MULTIVITAMIN ADULT PO) Take by mouth. daily     mupirocin ointment (BACTROBAN) 2 % Apply 1 Application topically 2 (two) times daily. 30 g 6   omeprazole (PRILOSEC) 40 MG capsule TAKE 1 CAPSULE(40 MG) BY MOUTH DAILY 90 capsule 1   valACYclovir (VALTREX) 1000 MG tablet Take 1 tablet (1000 mg total) by mouth twice daily (BID) for 10 days during outbreaks. 60 tablet 0   triamcinolone cream (KENALOG) 0.1 % Apply 1 Application topically 2 (two) times daily.  (Patient not taking: Reported on 08/08/2023)     No current facility-administered medications for this visit.   Allergies:  Patient has no known allergies.   Social History: The patient  reports that she has never smoked. She has never been exposed to tobacco smoke. She has never used smokeless tobacco. She reports current alcohol use. She reports that she does not use drugs.   Family History: The patient's family history includes Alcoholism in her brother; COPD in her brother; Healthy in her son and son; Heart attack in her mother; Hypertension in her mother; Obesity in her brother and mother; Prostate cancer in her father.   ROS:  Please see the history of present illness. Otherwise, complete review of systems is positive for none.  All other systems are reviewed and negative.   Physical Exam: VS:  BP 138/68 (BP Location: Right Arm, Patient Position: Sitting, Cuff Size: Normal)   Pulse 74   Ht 5\' 7"  (1.702 m)   Wt 151 lb (68.5 kg)   SpO2 95%   BMI 23.65 kg/m , BMI Body mass index is 23.65 kg/m.  Wt Readings from Last 3 Encounters:  08/08/23 151 lb (68.5 kg)  08/02/23 151 lb 1.9 oz (68.5 kg)  06/21/23 152 lb (68.9 kg)    General: Patient appears comfortable at rest. HEENT: Conjunctiva and lids normal, oropharynx clear with moist mucosa.  Neck: Supple, no elevated JVP or carotid bruits, no thyromegaly. Lungs: Clear to auscultation, nonlabored breathing at rest. Cardiac: Regular rate and rhythm, no S3 or significant systolic murmur, no pericardial rub. Abdomen: Soft, nontender, no hepatomegaly, bowel sounds present, no guarding or rebound. Extremities: No pitting edema, distal pulses 2+. Skin: Warm and dry. Musculoskeletal: No kyphosis. Neuropsychiatric: Alert and oriented x3, affect grossly appropriate.  Recent Labwork: 07/27/2023: ALT 22; AST 28; BUN 20; Creatinine, Ser 1.00; Hemoglobin 12.2; Platelets 290; Potassium 4.6; Sodium 143; TSH 5.310     Component Value Date/Time    CHOL 173 07/27/2023 0803   TRIG 77 07/27/2023 0803   HDL 60 07/27/2023 0803   CHOLHDL 2.9 07/27/2023 0803   LDLCALC 99 07/27/2023 0803     Assessment and Plan:  Palpitations: Ongoing palpitations for almost 20 years.  Occurs 1-2 times per week and last between 10 and 15 seconds.  Associated with dizziness and shortness of breath.  She also has shortness of breath at rest sometimes.  She drinks 2 cups of coffee in the morning and green tea in the evening.  Strongly encouraged to cut back on caffeinated products as these cause palpitations.  Will obtain 2-week event monitor to rule out any malignant arrhythmias.  DOE: She has shortness of breath associated with palpitations and symptoms of shortness of breath at rest.  Will obtain echocardiogram at baseline as she has history of Sjogren's syndrome and rule out any cardiomyopathy/PH.  HTN, controlled: Continue current antihypertensives, amlodipine-olmesartan 5-20 mg once daily.  Follows with PCP.  Medication Adjustments/Labs and Tests Ordered: Current medicines are reviewed at length with the patient today.  Concerns regarding medicines are outlined above.    Disposition:  Follow up pending results  Signed, William Laske Verne Spurr, MD, 08/08/2023 9:55 AM    [facility] [address], [place], [place] [address]

## 2023-08-08 NOTE — Patient Instructions (Addendum)
 Medication Instructions:  Your physician recommends that you continue on your current medications as directed. Please refer to the Current Medication list given to you today.  *If you need a refill on your cardiac medications before your next appointment, please call your pharmacy*  Lab Work: None If you have labs (blood work) drawn today and your tests are completely normal, you will receive your results only by: MyChart Message (if you have MyChart) OR A paper copy in the mail If you have any lab test that is abnormal or we need to change your treatment, we will call you to review the results.  Testing/Procedures: Your physician has requested that you have an echocardiogram. Echocardiography is a painless test that uses sound waves to create images of your heart. It provides your doctor with information about the size and shape of your heart and how well your heart's chambers and valves are working. This procedure takes approximately one hour. There are no restrictions for this procedure. Please do NOT wear cologne, perfume, aftershave, or lotions (deodorant is allowed). Please arrive 15 minutes prior to your appointment time.  Please note: We ask at that you not bring children with you during ultrasound (echo/ vascular) testing. Due to room size and safety concerns, children are not allowed in the ultrasound rooms during exams. Our front office staff cannot provide observation of children in our lobby area while testing is being conducted. An adult accompanying a patient to their appointment will only be allowed in the ultrasound room at the discretion of the ultrasound technician under special circumstances. We apologize for any inconvenience.   Follow-Up: At [facility], you and your health needs are our priority.  As part of our continuing mission to provide you with exceptional heart care, our providers are all part of one team.  This team includes your primary Cardiologist  (physician) and Advanced Practice Providers or APPs (Physician Assistants and Nurse Practitioners) who all work together to provide you with the care you need, when you need it.  Your next appointment:   To be determined   Provider:   You may see Vishnu P Mallipeddi, MD or one of the following Advanced Practice Providers on your designated Care Team:   [place], PA-C  Scotesia Palo, [place] Jacolyn Reedy, [place]     We recommend signing up for the patient portal called "MyChart".  Sign up information is provided on this After Visit Summary.  MyChart is used to connect with patients for Virtual Visits (Telemedicine).  Patients are able to view lab/test results, encounter notes, upcoming appointments, etc.  Non-urgent messages can be sent to your provider as well.   To learn more about what you can do with MyChart, go to [email or web address].   Other Instructions  ZIO XT- Long Term Monitor Instructions   Your physician has requested you wear your ZIO patch monitor___14____days.   This is a single patch monitor.  [employer] supplies one patch monitor per enrollment.  Additional stickers are not available.   Please do not apply patch if you will be having a Nuclear Stress Test, Echocardiogram, Cardiac CT, MRI, or Chest Xray during the time frame you would be wearing the monitor. The patch cannot be worn during these tests.  You cannot remove and re-apply the ZIO XT patch monitor.   Your ZIO patch monitor will be sent USPS Priority mail from [facility] directly to your home address. The monitor may also be mailed to a PO BOX if home delivery is  not available.   It may take 3-5 days to receive your monitor after you have been enrolled.   Once you have received you monitor, please review enclosed instructions.  Your monitor has already been registered assigning a specific monitor serial # to you.   Applying the monitor   Shave hair from upper left chest.   Hold abrader disc by  orange tab.  Rub abrader in 40 strokes over left upper chest as indicated in your monitor instructions.   Clean area with 4 enclosed alcohol pads .  Use all pads to assure are is cleaned thoroughly.  Let dry.   Apply patch as indicated in monitor instructions.  Patch will be place under collarbone on left side of chest with arrow pointing upward.   Rub patch adhesive wings for 2 minutes.Remove white label marked "1".  Remove white label marked "2".  Rub patch adhesive wings for 2 additional minutes.   While looking in a mirror, press and release button in center of patch.  A small green light will flash 3-4 times .  This will be your only indicator the monitor has been turned on.     Do not shower for the first 24 hours.  You may shower after the first 24 hours.   Press button if you feel a symptom. You will hear a small click.  Record Date, Time and Symptom in the Patient Log Book.   When you are ready to remove patch, follow instructions on last 2 pages of Patient Log Book.  Stick patch monitor onto last page of Patient Log Book.   Place Patient Log Book in [place] box.  Use locking tab on box and tape box closed securely.  The [employer] and [employer] has [employer] on it.  Please place in mailbox as soon as possible.  Your physician should have your test results approximately 7 days after the monitor has been mailed back to [facility].   Call [facility] Customer Care at [phone] if you have questions regarding your ZIO XT patch monitor.  Call them immediately if you see an orange light blinking on your monitor.   If your monitor falls off in less than 4 days contact our Monitor department at [phone].  If your monitor becomes loose or falls off after 4 days call [employer] at [phone] for suggestions on securing your monitor.

## 2023-08-14 ENCOUNTER — Ambulatory Visit (HOSPITAL_COMMUNITY)
Admission: RE | Admit: 2023-08-14 | Discharge: 2023-08-14 | Disposition: A | Discharge status: Home / Self Care | Source: Ambulatory Visit | Attending: Internal Medicine | Admitting: Internal Medicine

## 2023-08-14 DIAGNOSIS — R0602 Shortness of breath: Secondary | ICD-10-CM | POA: Diagnosis not present

## 2023-08-14 LAB — ECHOCARDIOGRAM COMPLETE
Area-P 1/2: 2.37 cm2
S' Lateral: 2.5 cm

## 2023-08-14 NOTE — Progress Notes (Signed)
*  PRELIMINARY RESULTS* Echocardiogram 2D Echocardiogram has been performed.  Stacey Drain 08/14/2023, 9:19 AM

## 2023-08-25 DIAGNOSIS — R002 Palpitations: Secondary | ICD-10-CM | POA: Diagnosis not present

## 2023-08-27 DIAGNOSIS — Z1211 Encounter for screening for malignant neoplasm of colon: Secondary | ICD-10-CM | POA: Diagnosis not present

## 2023-08-28 ENCOUNTER — Other Ambulatory Visit: Payer: Self-pay | Admitting: Family Medicine

## 2023-08-28 DIAGNOSIS — I10 Essential (primary) hypertension: Secondary | ICD-10-CM

## 2023-08-31 LAB — COLOGUARD: COLOGUARD: NEGATIVE

## 2023-09-03 ENCOUNTER — Ambulatory Visit (HOSPITAL_COMMUNITY): Payer: No Typology Code available for payment source

## 2023-09-05 ENCOUNTER — Ambulatory Visit (HOSPITAL_COMMUNITY)
Admission: RE | Admit: 2023-09-05 | Discharge: 2023-09-05 | Disposition: A | Discharge status: Home / Self Care | Payer: No Typology Code available for payment source | Source: Ambulatory Visit | Attending: Family Medicine | Admitting: Family Medicine

## 2023-09-05 ENCOUNTER — Encounter (HOSPITAL_COMMUNITY): Payer: Self-pay

## 2023-09-05 DIAGNOSIS — Z1231 Encounter for screening mammogram for malignant neoplasm of breast: Secondary | ICD-10-CM | POA: Insufficient documentation

## 2023-09-11 ENCOUNTER — Ambulatory Visit (INDEPENDENT_AMBULATORY_CARE_PROVIDER_SITE_OTHER): Payer: Self-pay

## 2023-09-11 VITALS — Ht 67.0 in | Wt 150.0 lb

## 2023-09-11 DIAGNOSIS — Z0000 Encounter for general adult medical examination without abnormal findings: Secondary | ICD-10-CM | POA: Diagnosis not present

## 2023-09-11 NOTE — Progress Notes (Signed)
 Please attest and cosign this visit due to patients primary care provider not being in the office at the time the visit was completed.   Subjective:   Kimberly Singh is a 68 y.o. who presents for a Medicare Wellness preventive visit.  Visit Complete: Virtual I connected with  [name] on 09/11/23 by a audio enabled telemedicine application and verified that I am speaking with the correct person using two identifiers.  Patient Location: Home  Provider Location: Home Office  I discussed the limitations of evaluation and management by telemedicine. The patient expressed understanding and agreed to proceed.  Vital Signs: Because this visit was a virtual/telehealth visit, some criteria may be missing or patient reported. Any vitals not documented were not able to be obtained and vitals that have been documented are patient reported.  VideoDeclined- This patient declined Librarian, academic. Therefore the visit was completed with audio only.  Persons Participating in Visit: Patient.  AWV Questionnaire: No: Patient Medicare AWV questionnaire was not completed prior to this visit.  Cardiac Risk Factors include: advanced age (>57men, >61 women);hypertension     Objective:    Today's Vitals   09/11/23 0839  Weight: 150 lb (68 kg)  Height: 5\' 7"  (1.702 m)   Body mass index is 23.49 kg/m.     09/11/2023    9:00 AM 06/01/2022   10:12 AM  Advanced Directives  Does Patient Have a Medical Advance Directive? Yes No  Type of Estate agent of [place];Living will   Does patient want to make changes to medical advance directive? No - Patient declined   Copy of Healthcare Power of Attorney in Chart? Yes - validated most recent copy scanned in chart (See row information)   Would patient like information on creating a medical advance directive?  No - Patient declined    Current Medications (verified) Outpatient Encounter Medications as of  09/11/2023  Medication Sig   amLODipine -olmesartan  (AZOR ) 5-20 MG tablet TAKE 1 TABLET BY MOUTH DAILY   Calcium Carbonate-Vitamin D  (CALTRATE 600+D PO) Take by mouth daily.   levothyroxine  (SYNTHROID ) 100 MCG tablet Take 1 tablet (100 mcg total) by mouth daily.   Multiple Vitamin (MULTIVITAMIN ADULT PO) Take by mouth. daily   mupirocin  ointment (BACTROBAN ) 2 % Apply 1 Application topically 2 (two) times daily.   omeprazole  (PRILOSEC) 40 MG capsule TAKE 1 CAPSULE(40 MG) BY MOUTH DAILY   valACYclovir  (VALTREX ) 1000 MG tablet Take 1 tablet (1000 mg total) by mouth twice daily (BID) for 10 days during outbreaks.   triamcinolone cream (KENALOG) 0.1 % Apply 1 Application topically 2 (two) times daily. (Patient not taking: Reported on 08/02/2023)   No facility-administered encounter medications on file as of 09/11/2023.    Allergies (verified) Patient has no known allergies.   History: Past Medical History:  Diagnosis Date   Sjogren's syndrome (HCC)    Spider varicose veins    Past Surgical History:  Procedure Laterality Date   CATARACT EXTRACTION, BILATERAL     CHOLECYSTECTOMY     KNEE ARTHROSCOPY W/ MENISCAL REPAIR Right    TUBAL LIGATION     age 64   WRIST FRACTURE SURGERY Left    Family History  Problem Relation Age of Onset   Obesity Mother    Heart attack Mother    Hypertension Mother    Prostate cancer Father    Obesity Brother    COPD Brother    Alcoholism Brother    Healthy Son    Healthy  Son    Social History   Socioeconomic History   Marital status: Married    Spouse name: Not on file   Number of children: Not on file   Years of education: Not on file   Highest education level: Some college, no degree  Occupational History   Not on file  Tobacco Use   Smoking status: Never    Passive exposure: Never   Smokeless tobacco: Never  Vaping Use   Vaping status: Never Used  Substance and Sexual Activity   Alcohol use: Yes    Comment: occ   Drug use: Never    Sexual activity: Yes    Partners: Male  Other Topics Concern   Not on file  Social History Narrative   Moved here about a year and a half ago from [place]  State   Social Drivers of Health   Financial Resource Strain: Low Risk  (09/11/2023)   Overall Financial Resource Strain (CARDIA)    Difficulty of Paying Living Expenses: Not hard at all  Food Insecurity: No Food Insecurity (09/11/2023)   Hunger Vital Sign    Worried About Running Out of Food in the Last Year: Never true    Ran Out of Food in the Last Year: Never true  Transportation Needs: No Transportation Needs (09/11/2023)   PRAPARE - Administrator, Civil Service (Medical): No    Lack of Transportation (Non-Medical): No  Physical Activity: Sufficiently Active (09/11/2023)   Exercise Vital Sign    Days of Exercise per Week: 7 days    Minutes of Exercise per Session: 30 min  Stress: No Stress Concern Present (09/11/2023)   [employer] of Occupational Health - Occupational Stress Questionnaire    Feeling of Stress : Not at all  Social Connections: Moderately Isolated (09/11/2023)   Social Connection and Isolation Panel [NHANES]    Frequency of Communication with Friends and Family: More than three times a week    Frequency of Social Gatherings with Friends and Family: More than three times a week    Attends Religious Services: Never    Database administrator or Organizations: No    Attends Engineer, structural: Never    Marital Status: Married    Tobacco Counseling Counseling given: Yes    Clinical Intake:  Pre-visit preparation completed: Yes  Pain : No/denies pain     BMI - recorded: 23.49 Nutritional Status: BMI of 19-24  Normal Nutritional Risks: None Diabetes: No  Lab Results  Component Value Date   HGBA1C 5.7 (H) 07/27/2023   HGBA1C 5.7 (H) 01/18/2023   HGBA1C 5.6 05/19/2022     How often do you need to have someone help you when you read instructions, pamphlets, or other written  materials from your doctor or pharmacy?: 1 - Never  Interpreter Needed?: No  Information entered by :: Sally Crazier CMA   Activities of Daily Living     09/11/2023    8:43 AM  In your present state of health, do you have any difficulty performing the following activities:  Hearing? 0  Vision? 0  Difficulty concentrating or making decisions? 0  Walking or climbing stairs? 0  Dressing or bathing? 0  Doing errands, shopping? 0  Preparing Food and eating ? N  Using the Toilet? N  In the past six months, have you accidently leaked urine? N  Do you have problems with loss of bowel control? N  Managing your Medications? N  Managing your Finances? N  Housekeeping or managing your Housekeeping? N    Patient Care Team: Zarwolo, Gloria, FNP as PCP - General (Family Medicine) Lasalle Pointer, MD as PCP - Cardiology (Cardiology) Diamond Formica, MD as Consulting Physician (Pulmonary Disease)  Indicate any recent Medical Services you may have received from other than [facility] providers in the past year (date may be approximate).     Assessment:   This is a routine wellness examination for Jaylah.  Hearing/Vision screen Hearing Screening - Comments:: Patient denies any hearing difficulties.   Vision Screening - Comments:: Wears rx glasses - up to date with routine eye exams  Patient sees [facility] [place]    Goals Addressed             This Visit's Progress    Patient Stated       Remain active and independent. Go on a nice trip to [facility]       Depression Screen     09/11/2023    9:01 AM 08/02/2023    8:41 AM 06/21/2023    1:10 PM 01/17/2023    3:57 PM 05/29/2022    9:03 AM 04/07/2022   10:52 AM 01/20/2022   10:23 AM  PHQ 2/9 Scores  PHQ - 2 Score 0 0 0 0 0 0 0  PHQ- 9 Score 0 0  0 0      Fall Risk     09/11/2023    8:52 AM 08/02/2023    8:40 AM 06/21/2023    1:10 PM 01/17/2023    3:56 PM 05/29/2022    9:03 AM  Fall Risk   Falls in the past year? 0 0 0 0 0  Number  falls in past yr: 0 0 0 0 0  Injury with Fall? 0 0 0 0 0  Risk for fall due to : No Fall Risks No Fall Risks  No Fall Risks No Fall Risks  Follow up Falls prevention discussed;Falls evaluation completed;Education provided Falls evaluation completed;Education provided;Falls prevention discussed  Falls evaluation completed Falls evaluation completed    MEDICARE RISK AT HOME:  Medicare Risk at Home Any stairs in or around the home?: Yes If so, are there any without handrails?: No Home free of loose throw rugs in walkways, pet beds, electrical cords, etc?: Yes Adequate lighting in your home to reduce risk of falls?: Yes Life alert?: No Use of a cane, walker or w/c?: No Grab bars in the bathroom?: Yes Shower chair or bench in shower?: Yes Elevated toilet seat or a handicapped toilet?: Yes  TIMED UP AND GO:  Was the test performed?  No  Cognitive Function: 6CIT completed        09/11/2023    8:59 AM 01/20/2022   10:25 AM  6CIT Screen  What Year? 0 points 0 points  What month? 0 points 0 points  What time? 0 points 0 points  Count back from 20 0 points 0 points  Months in reverse 0 points 0 points  Repeat phrase 0 points 0 points  Total Score 0 points 0 points    Immunizations Immunization History  Administered Date(s) Administered   Fluad Quad(high Dose 65+) 01/20/2022   Fluad Trivalent(High Dose 65+) 01/18/2023   PFIZER(Purple Top)SARS-COV-2 Vaccination 05/30/2019, 04/05/2020   PNEUMOCOCCAL CONJUGATE-20 04/07/2022   Zoster Recombinant(Shingrix ) 05/29/2022, 07/28/2022    Screening Tests Health Maintenance  Topic Date Due   DTaP/Tdap/Td (1 - Tdap) Never done   COVID-19 Vaccine (3 - 2024-25 season) 01/07/2023   DEXA SCAN  10/14/2023  INFLUENZA VACCINE  12/07/2023   MAMMOGRAM  09/04/2024   Medicare Annual Wellness (AWV)  09/10/2024   Fecal DNA (Cologuard)  08/27/2026   Pneumonia Vaccine 46+ Years old  Completed   Hepatitis C Screening  Completed   Zoster Vaccines-  Shingrix   Completed   HPV VACCINES  Aged Out   Meningococcal B Vaccine  Aged Out    Health Maintenance  Health Maintenance Due  Topic Date Due   DTaP/Tdap/Td (1 - Tdap) Never done   COVID-19 Vaccine (3 - 2024-25 season) 01/07/2023   Health Maintenance Items Addressed: Health Maintenance is up to date.  Patient advised of recommended vaccines and where to obtain those vaccines with verbal understanding  Additional Screening:  Vision Screening: Recommended annual ophthalmology exams for early detection of glaucoma and other disorders of the eye.  Dental Screening: Recommended annual dental exams for proper oral hygiene  Community Resource Referral / Chronic Care Management: CRR required this visit?  No   CCM required this visit?  No     Plan:     I have personally reviewed and noted the following in the patient's chart:   Medical and social history Use of alcohol, tobacco or illicit drugs  Current medications and supplements including opioid prescriptions. Patient is not currently taking opioid prescriptions. Functional ability and status Nutritional status Physical activity Advanced directives List of other physicians Hospitalizations, surgeries, and ER visits in previous 12 months Vitals Screenings to include cognitive, depression, and falls Referrals and appointments  In addition, I have reviewed and discussed with patient certain preventive protocols, quality metrics, and best practice recommendations. A written personalized care plan for preventive services as well as general preventive health recommendations were provided to patient.      Alajah Witman, CMA   09/11/2023   After Visit Summary: (MyChart) Due to this being a telephonic visit, the after visit summary with patients personalized plan was offered to patient via MyChart   Notes: Nothing significant to report at this time.

## 2023-09-11 NOTE — Patient Instructions (Signed)
 Kimberly Singh , Thank you for taking time to come for your Medicare Wellness Visit. I appreciate your ongoing commitment to your health goals. Please review the following plan we discussed and let me know if I can assist you in the future.   Referrals/Orders/Follow-Ups/Clinician Recommendations:    Next Medicare AWV: Sep 15, 2024 at 9:20 am video visit.       Aim for 30 minutes of exercise or brisk walking, 6-8 glasses of water, and 5 servings of fruits and vegetables each day.   This is a list of the screening recommended for you and due dates:  Health Maintenance  Topic Date Due   DTaP/Tdap/Td vaccine (1 - Tdap) Never done   Colon Cancer Screening  Never done   COVID-19 Vaccine (3 - 2024-25 season) 01/07/2023   Medicare Annual Wellness Visit  01/21/2023   Flu Shot  12/07/2023   Mammogram  09/04/2025   Pneumonia Vaccine  Completed   DEXA scan (bone density measurement)  Completed   Hepatitis C Screening  Completed   Zoster (Shingles) Vaccine  Completed   HPV Vaccine  Aged Out   Meningitis B Vaccine  Aged Out    Advanced directives: (In Chart) A copy of your advanced directives are scanned into your chart should your provider ever need it. Advance Care Planning is important because it:  [x]  Makes sure you receive the medical care that is consistent with your values, goals, and preferences  [x]  It provides guidance to your family and loved ones and it also reduces their decisional burden about whether or not they are making the right decisions based on what you want done  Follow the link provided in your after visit summary or read over the paperwork we have mailed to you to help you started getting your Advance Directives in place. If you need assistance in completing these, please reach out to us  so that we can help you!  Next Medicare Annual Wellness Visit scheduled for next year: yes  Understanding Your Risk for Falls Millions of people have serious injuries from falls each year.  It is important to understand your risk of falling. Talk with your health care provider about your risk and what you can do to lower it. If you do have a serious fall, make sure to tell your provider. Falling once raises your risk of falling again. How can falls affect me? Serious injuries from falls are common. These include: Broken bones, such as hip fractures. Head injuries, such as traumatic brain injuries (TBI) or concussions. A fear of falling can cause you to avoid activities and stay at home. This can make your muscles weaker and raise your risk for a fall. What can increase my risk? There are a number of risk factors that increase your risk for falling. The more risk factors you have, the higher your risk of falling. Serious injuries from a fall happen most often to people who are older than 69 years old. Teenagers and young adults ages 49-29 are also at higher risk. Common risk factors include: Weakness in the lower body. Being generally weak or confused due to long-term (chronic) illness. Dizziness or balance problems. Poor vision. Medicines that cause dizziness or drowsiness. These may include: Medicines for your blood pressure, heart, anxiety, insomnia, or swelling (edema). Pain medicines. Muscle relaxants. Other risk factors include: Drinking alcohol. Having had a fall in the past. Having foot pain or wearing improper footwear. Working at a dangerous job. Having any of the following in  your home: Tripping hazards, such as floor clutter or loose rugs. Poor lighting. Pets. Having dementia or memory loss. What actions can I take to lower my risk of falling?  Physical activity Stay physically fit. Do strength and balance exercises. Consider taking a regular class to build strength and balance. Yoga and tai chi are good options. Vision Have your eyes checked every year and your prescription for glasses or contacts updated as needed. Shoes and walking aids Wear non-skid  shoes. Wear shoes that have rubber soles and low heels. Do not wear high heels. Do not walk around the house in socks or slippers. Use a cane or walker as told by your provider. Home safety Attach secure railings on both sides of your stairs. Install grab bars for your bathtub, shower, and toilet. Use a non-skid mat in your bathtub or shower. Attach bath mats securely with double-sided, non-slip rug tape. Use good lighting in all rooms. Keep a flashlight near your bed. Make sure there is a clear path from your bed to the bathroom. Use night-lights. Do not use throw rugs. Make sure all carpeting is taped or tacked down securely. Remove all clutter from walkways and stairways, including extension cords. Repair uneven or broken steps and floors. Avoid walking on icy or slippery surfaces. Walk on the grass instead of on icy or slick sidewalks. Use ice melter to get rid of ice on walkways in the winter. Use a cordless phone. Questions to ask your health care provider Can you help me check my risk for a fall? Do any of my medicines make me more likely to fall? Should I take a vitamin D  supplement? What exercises can I do to improve my strength and balance? Should I make an appointment to have my vision checked? Do I need a bone density test to check for weak bones (osteoporosis)? Would it help to use a cane or a walker? Where to find more information Centers for Disease Control and Prevention, STEADI: TonerPromos.no Community-Based Fall Prevention Programs: TonerPromos.no [employer] on Aging: BaseRingTones.pl Contact a health care provider if: You fall at home. You are afraid of falling at home. You feel weak, drowsy, or dizzy. This information is not intended to replace advice given to you by your health care provider. Make sure you discuss any questions you have with your health care provider. Document Revised: 12/26/2021 Document Reviewed: 12/26/2021 Elsevier Patient Education  2024 [employer].

## 2023-09-12 NOTE — Progress Notes (Signed)
 Office Visit Note  Patient: Kimberly Singh             Date of Birth: 1956-02-20           MRN: [id number]             PCP: Zarwolo, Gloria, FNP Referring: Zarwolo, Gloria, FNP Visit Date: 09/24/2023 Occupation: @GUAROCC @  Subjective:  Routine follow up   History of Present Illness: Dalissa Lovin is a 68 y.o. female with history of mixed connective tissue disease and fibromyalgia.  Patient has not currently taking immunosuppressive agents.  Since her last office visit she established care with cardiology and pulmonology as recommended.  She has had a baseline chest x-ray and an echocardiogram.  She also performed a 6-minute walk test but has not had pulmonary function testing.  She denies any new or worsening pulmonary symptoms.  She denies any recent rashes but has been trying to avoid UV exposure and exposure to heat which has been a trigger in the past.  She denies any increased joint pain or joint swelling at this time.  She denies any symptoms of Raynaud's phenomenon.  She has mouth dryness but denies any eye dryness.    Activities of Daily Living:  Patient denies any morning stiffness  Patient Reports nocturnal pain.  Difficulty dressing/grooming: Denies Difficulty climbing stairs: Denies Difficulty getting out of chair: Denies Difficulty using hands for taps, buttons, cutlery, and/or writing: Denies  Review of Systems  Constitutional:  Negative for fatigue.  HENT:  Positive for mouth dryness. Negative for mouth sores.   Eyes:  Negative for dryness.  Respiratory:  Negative for shortness of breath.   Cardiovascular:  Negative for chest pain and palpitations.  Gastrointestinal:  Negative for blood in stool, constipation and diarrhea.  Endocrine: Negative for increased urination.  Genitourinary:  Negative for involuntary urination.  Musculoskeletal:  Positive for gait problem. Negative for joint pain, joint pain, joint swelling, myalgias, muscle weakness, morning stiffness, muscle  tenderness and myalgias.  Skin:  Positive for rash and sensitivity to sunlight. Negative for color change and hair loss.  Allergic/Immunologic: Negative for susceptible to infections.  Neurological:  Negative for dizziness and headaches.  Hematological:  Negative for swollen glands.  Psychiatric/Behavioral:  Negative for depressed mood and sleep disturbance. The patient is not nervous/anxious.     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    Past Medical History:  Diagnosis Date   Sjogren's syndrome (HCC)    Spider varicose veins     Family History  Problem Relation Age of Onset   Obesity Mother    Heart attack Mother    Hypertension Mother    Prostate cancer Father    Obesity Brother    COPD Brother    Alcoholism Brother    Healthy Son    Healthy Son    Past Surgical History:  Procedure Laterality Date   CATARACT EXTRACTION, BILATERAL     CHOLECYSTECTOMY     KNEE ARTHROSCOPY W/ MENISCAL REPAIR Right    TUBAL LIGATION  age 67   WRIST FRACTURE SURGERY Left    Social History   Social History Narrative   Moved here about a year and a half ago from [place]   Immunization History  Administered Date(s) Administered   Fluad Quad(high Dose 65+) 01/20/2022   Fluad Trivalent(High Dose 65+) 01/18/2023   PFIZER(Purple Top)SARS-COV-2 Vaccination 05/30/2019, 04/05/2020   PNEUMOCOCCAL CONJUGATE-20 04/07/2022   Zoster Recombinant(Shingrix ) 05/29/2022, 07/28/2022     Objective: Vital  Signs: BP 137/83 (BP Location: Left Arm, Patient Position: Sitting, Cuff Size: Normal)   Pulse 66   Resp 16   Ht 5' 7.5" (1.715 m)   Wt 151 lb 3.2 oz (68.6 kg)   BMI 23.33 kg/m    Physical Exam Vitals and nursing note reviewed.  Constitutional:      Appearance: She is well-developed.  HENT:     Head: Normocephalic and atraumatic.  Eyes:     Conjunctiva/sclera: Conjunctivae normal.  Cardiovascular:     Rate and Rhythm: Normal rate and regular rhythm.     Heart sounds: Normal heart sounds.  Pulmonary:     Effort: Pulmonary effort is normal.     Breath sounds: Normal breath sounds.  Abdominal:     General: Bowel sounds are normal.     Palpations: Abdomen is soft.  Musculoskeletal:     Cervical back: Normal range of motion.  Lymphadenopathy:     Cervical: No cervical adenopathy.  Skin:    General: Skin is warm and dry.     Capillary Refill: Capillary refill takes less than 2 seconds.  Neurological:     Mental Status: She is alert and oriented to person, place, and time.  Psychiatric:        Behavior: Behavior normal.      Musculoskeletal Exam: C-spine, thoracic spine, lumbar spine have good range of motion.  Shoulder joints and elbow joints have good range of motion.  Limited extension of the left wrist.  Left fourth PIP joint contracture.  Hip joints have good range of motion with no groin pain.  Knee joints have good range of motion with no warmth or effusion.  Ankle joints have good range of motion with no tenderness or joint swelling.  CDAI Exam: CDAI Score: -- Patient Global: --; Provider Global: -- Swollen: --; Tender: -- Joint Exam 09/24/2023   No joint exam has been documented for this visit   There is currently no information documented on the homunculus. Go to the Rheumatology activity and complete the homunculus joint exam.  Investigation: No additional findings.  Imaging: LONG TERM MONITOR (3-14 DAYS) Result Date: 09/21/2023   Patch wear time was for 13  days and 22 hours.   Normal sinus rhythm ranging from 43 to 156 bpm with an average HR 77 bpm.   1 run of nonsustained SVT lasting 22 beats/10.2 seconds and 1 run of nonsustained SVT with aberrancy lasting 5 beats.   No evidence of A-fib/A-flutter, AV block or pauses.   <1% PAC burden and <1% PVC burden.   No patient triggered events noted.   MM 3D SCREENING MAMMOGRAM BILATERAL BREAST Result Date: 09/06/2023 CLINICAL DATA:  Screening. EXAM: DIGITAL SCREENING BILATERAL MAMMOGRAM WITH TOMOSYNTHESIS AND CAD TECHNIQUE: Bilateral screening digital craniocaudal and mediolateral oblique mammograms were obtained. Bilateral screening digital breast tomosynthesis was performed. The images were evaluated with computer-aided detection. COMPARISON:  None available. ACR Breast Density Category b: There are scattered areas of fibroglandular density. FINDINGS: There are no findings suspicious for malignancy. IMPRESSION: No  mammographic evidence of malignancy. A result letter of this screening mammogram will be mailed directly to the patient. RECOMMENDATION: Screening mammogram in one year. (Code:SM-B-01Y) BI-RADS CATEGORY  1: Negative. Electronically Signed   By: Amanda Jungling M.D.   On: 09/06/2023 15:23    Recent Labs: Lab Results  Component Value Date   WBC 4.2 07/27/2023   HGB 12.2 07/27/2023   PLT 290 07/27/2023   NA 143 07/27/2023   K 4.6 07/27/2023   CL 105 07/27/2023   CO2 22 07/27/2023   GLUCOSE 84 07/27/2023   BUN 20 07/27/2023   CREATININE 1.00 07/27/2023   BILITOT 0.2 07/27/2023   ALKPHOS 139 (H) 07/27/2023   AST 28 07/27/2023   ALT 22 07/27/2023   PROT 7.1 07/27/2023   ALBUMIN 4.0 07/27/2023   CALCIUM 9.4 07/27/2023    Speciality Comments: No specialty comments available.  Procedures:  No procedures performed Allergies: Patient has no known allergies.     Assessment / Plan:     Visit Diagnoses: Mixed connective tissue disease (HCC) - Dx at [place] in [place]. +ANA 1:320homogenous  speckled, joint swelling, photosensitivity, +RNP 1.3, +Ro>8, dry eyes, oral ulcers: She is not exhibiting any new or worsening symptoms.  She is not currently taking immunosuppressive agents and has not needed to take prednisone  recently.  Patient experiences intermittent photosensitive rashes but tries to avoid UV exposure and exposure to heat.  No rash was noted currently.  She has not had any symptoms of Raynaud's phenomenon.  No signs of sclerodactyly noted.  No oral or nasal ulcerations.  She has mouth dryness but has not had any increased eye dryness. Lab work from 01/26/23 reviewed today in the office: ANA remains positive, Ro antibody remains positive, anticardiolipin antibodies negative, beta-2  glycoprotein antibodies negative, complements WNL, dsDNA negative, La negative, ESR WNL, RF 26, CK 164, smith antibody negative, RNP 2.7, and Scl-70-. Patient has established care with cardiology and pulmonology: echocardiogram performed 08/14/23 and CXR 07/13/23-no fibrosis noted.   No PFTs performed.  No new pulmonary symptoms.  She was advised to notify us  if she develops any new or worsening symptoms.  The following lab work will be obtained today for further evaluation.  She will follow-up in the office in 5 to 6 months or sooner if needed. - Plan: Protein / creatinine ratio, urine, CBC with Differential/Platelet, C3 and C4, Sedimentation rate, Comprehensive metabolic panel with GFR, ANA, Anti-DNA antibody, double-stranded, Sjogrens syndrome-A extractable nuclear antibody, Rheumatoid factor, RNP Antibody  Long-term corticosteroid use -No longer taking prednisone .  She had frequent use of prednisone  since 2013 for rash. CBC and CMP updated on 07/18/23.    Primary osteoarthritis of both hands: No synovitis was noted on examination today.  Limited extension of the left wrist noted.  Contracture of the left fourth PIP joint.  Primary osteoarthritis of both feet: Good range of motion of both ankle joints with no  joint tenderness or swelling.  Other medical conditions are listed as follows:  Primary hypertension: Blood pressure was 137/83 today in the office.  Spider varicose veins  History of hypothyroidism  Stasis dermatitis of both legs  Orders: Orders Placed This Encounter  Procedures   Protein / creatinine ratio, urine   CBC with Differential/Platelet   C3 and C4   Sedimentation rate   Comprehensive metabolic panel with GFR   ANA   Anti-DNA antibody, double-stranded   Sjogrens syndrome-A extractable nuclear antibody   Rheumatoid factor   RNP Antibody   No orders of  the defined types were placed in this encounter.   Follow-Up Instructions: Return in about 5 months (around 02/24/2024) for MCTD.   Romayne Clubs, PA-C  Note - This record has been created using Dragon software.  Chart creation errors have been sought, but may not always  have been located. Such creation errors do not reflect on  the standard of medical care.

## 2023-09-21 DIAGNOSIS — R002 Palpitations: Secondary | ICD-10-CM

## 2023-09-24 ENCOUNTER — Ambulatory Visit: Payer: No Typology Code available for payment source | Attending: Physician Assistant | Admitting: Physician Assistant

## 2023-09-24 ENCOUNTER — Encounter: Payer: Self-pay | Admitting: Physician Assistant

## 2023-09-24 VITALS — BP 137/83 | HR 66 | Resp 16 | Ht 67.5 in | Wt 151.2 lb

## 2023-09-24 DIAGNOSIS — M19071 Primary osteoarthritis, right ankle and foot: Secondary | ICD-10-CM

## 2023-09-24 DIAGNOSIS — M351 Other overlap syndromes: Secondary | ICD-10-CM

## 2023-09-24 DIAGNOSIS — Z7952 Long term (current) use of systemic steroids: Secondary | ICD-10-CM

## 2023-09-24 DIAGNOSIS — M19042 Primary osteoarthritis, left hand: Secondary | ICD-10-CM | POA: Diagnosis not present

## 2023-09-24 DIAGNOSIS — M19072 Primary osteoarthritis, left ankle and foot: Secondary | ICD-10-CM | POA: Diagnosis not present

## 2023-09-24 DIAGNOSIS — I872 Venous insufficiency (chronic) (peripheral): Secondary | ICD-10-CM | POA: Diagnosis not present

## 2023-09-24 DIAGNOSIS — I10 Essential (primary) hypertension: Secondary | ICD-10-CM

## 2023-09-24 DIAGNOSIS — M19041 Primary osteoarthritis, right hand: Secondary | ICD-10-CM | POA: Diagnosis not present

## 2023-09-24 DIAGNOSIS — I868 Varicose veins of other specified sites: Secondary | ICD-10-CM | POA: Diagnosis not present

## 2023-09-24 DIAGNOSIS — Z8639 Personal history of other endocrine, nutritional and metabolic disease: Secondary | ICD-10-CM | POA: Diagnosis not present

## 2023-09-26 ENCOUNTER — Ambulatory Visit: Payer: Self-pay | Admitting: Physician Assistant

## 2023-09-26 LAB — CBC WITH DIFFERENTIAL/PLATELET
Absolute Lymphocytes: 1442 {cells}/uL (ref 850–3900)
Absolute Monocytes: 378 {cells}/uL (ref 200–950)
Basophils Absolute: 32 {cells}/uL (ref 0–200)
Basophils Relative: 0.6 %
Eosinophils Absolute: 59 {cells}/uL (ref 15–500)
Eosinophils Relative: 1.1 %
HCT: 36.3 % (ref 35.0–45.0)
Hemoglobin: 11.9 g/dL (ref 11.7–15.5)
MCH: 30.8 pg (ref 27.0–33.0)
MCHC: 32.8 g/dL (ref 32.0–36.0)
MCV: 94 fL (ref 80.0–100.0)
MPV: 10 fL (ref 7.5–12.5)
Monocytes Relative: 7 %
Neutro Abs: 3488 {cells}/uL (ref 1500–7800)
Neutrophils Relative %: 64.6 %
Platelets: 312 10*3/uL (ref 140–400)
RBC: 3.86 10*6/uL (ref 3.80–5.10)
RDW: 13.6 % (ref 11.0–15.0)
Total Lymphocyte: 26.7 %
WBC: 5.4 10*3/uL (ref 3.8–10.8)

## 2023-09-26 LAB — COMPREHENSIVE METABOLIC PANEL WITH GFR
AG Ratio: 1.4 (calc) (ref 1.0–2.5)
ALT: 18 U/L (ref 6–29)
AST: 29 U/L (ref 10–35)
Albumin: 4.4 g/dL (ref 3.6–5.1)
Alkaline phosphatase (APISO): 113 U/L (ref 37–153)
BUN: 18 mg/dL (ref 7–25)
CO2: 25 mmol/L (ref 20–32)
Calcium: 9 mg/dL (ref 8.6–10.4)
Chloride: 104 mmol/L (ref 98–110)
Creat: 0.95 mg/dL (ref 0.50–1.05)
Globulin: 3.1 g/dL (ref 1.9–3.7)
Glucose, Bld: 83 mg/dL (ref 65–99)
Potassium: 4.7 mmol/L (ref 3.5–5.3)
Sodium: 138 mmol/L (ref 135–146)
Total Bilirubin: 0.3 mg/dL (ref 0.2–1.2)
Total Protein: 7.5 g/dL (ref 6.1–8.1)
eGFR: 65 mL/min/{1.73_m2} (ref >=60)

## 2023-09-26 LAB — PROTEIN / CREATININE RATIO, URINE
Creatinine, Urine: 50 mg/dL (ref 20–275)
Protein/Creat Ratio: 200 mg/g{creat} — ABNORMAL HIGH (ref 24–184)
Protein/Creatinine Ratio: 0.2 mg/mg{creat} — ABNORMAL HIGH (ref 0.024–0.184)
Total Protein, Urine: 10 mg/dL (ref 5–24)

## 2023-09-26 LAB — C3 AND C4
C3 Complement: 131 mg/dL (ref 83–193)
C4 Complement: 28 mg/dL (ref 15–57)

## 2023-09-26 LAB — ANTI-NUCLEAR AB-TITER (ANA TITER)
ANA TITER: 1:320 {titer} — ABNORMAL HIGH
ANA TITER: 1:80 {titer} — ABNORMAL HIGH
ANA Titer 1: 1:640 {titer} — ABNORMAL HIGH

## 2023-09-26 LAB — ANA: Anti Nuclear Antibody (ANA): POSITIVE — AB

## 2023-09-26 LAB — ANTI-DNA ANTIBODY, DOUBLE-STRANDED: ds DNA Ab: 1 [IU]/mL

## 2023-09-26 LAB — RNP ANTIBODY: Ribonucleic Protein(ENA) Antibody, IgG: 2.7 AI — AB

## 2023-09-26 LAB — SJOGRENS SYNDROME-A EXTRACTABLE NUCLEAR ANTIBODY: SSA (Ro) (ENA) Antibody, IgG: >8 AI — AB

## 2023-09-26 LAB — SEDIMENTATION RATE: Sed Rate: 48 mm/h — ABNORMAL HIGH (ref 0–30)

## 2023-09-26 LAB — RHEUMATOID FACTOR: Rheumatoid fact SerPl-aCnc: 22 [IU]/mL — ABNORMAL HIGH (ref <14)

## 2023-09-26 NOTE — Progress Notes (Signed)
 Urine protein creatinine ratio is slightly elevated.  Total urine protein is WNL  ESR is elevated-48.  Ro antibody remains positive.   RNP remains positive.   RF remains positive.   CBC and CMP WNL Complements WNL.   dsDNA is negative. ANA pending

## 2023-09-27 NOTE — Progress Notes (Signed)
 ANA remains positive.

## 2023-10-09 ENCOUNTER — Other Ambulatory Visit: Payer: Self-pay | Admitting: Family Medicine

## 2023-10-09 DIAGNOSIS — R12 Heartburn: Secondary | ICD-10-CM

## 2023-10-12 ENCOUNTER — Ambulatory Visit: Payer: Self-pay | Admitting: Internal Medicine

## 2023-10-25 ENCOUNTER — Ambulatory Visit: Admitting: Family Medicine

## 2023-10-28 ENCOUNTER — Other Ambulatory Visit: Payer: Self-pay | Admitting: Family Medicine

## 2023-10-28 DIAGNOSIS — I10 Essential (primary) hypertension: Secondary | ICD-10-CM

## 2023-12-13 DIAGNOSIS — E038 Other specified hypothyroidism: Secondary | ICD-10-CM | POA: Diagnosis not present

## 2023-12-14 ENCOUNTER — Ambulatory Visit: Payer: Self-pay | Admitting: Family Medicine

## 2023-12-14 LAB — TSH+FREE T4
Free T4: 1.49 ng/dL (ref 0.82–1.77)
TSH: 2.85 u[IU]/mL (ref 0.450–4.500)

## 2023-12-16 DIAGNOSIS — S6991XA Unspecified injury of right wrist, hand and finger(s), initial encounter: Secondary | ICD-10-CM | POA: Diagnosis not present

## 2023-12-20 ENCOUNTER — Ambulatory Visit: Admitting: Family Medicine

## 2023-12-28 ENCOUNTER — Encounter: Payer: Self-pay | Admitting: Radiology

## 2024-01-25 ENCOUNTER — Other Ambulatory Visit: Payer: Self-pay | Admitting: Family Medicine

## 2024-01-25 DIAGNOSIS — E038 Other specified hypothyroidism: Secondary | ICD-10-CM

## 2024-01-28 ENCOUNTER — Other Ambulatory Visit: Payer: Self-pay | Admitting: Family Medicine

## 2024-01-28 DIAGNOSIS — I10 Essential (primary) hypertension: Secondary | ICD-10-CM

## 2024-02-22 ENCOUNTER — Other Ambulatory Visit (HOSPITAL_COMMUNITY): Payer: Self-pay

## 2024-02-23 ENCOUNTER — Other Ambulatory Visit (HOSPITAL_COMMUNITY): Payer: Self-pay

## 2024-02-23 MED ORDER — LEVOTHYROXINE SODIUM 100 MCG PO TABS
100.0000 ug | ORAL_TABLET | Freq: Every day | ORAL | 1 refills | Status: AC
  Filled 2024-05-02: qty 90, 90d supply, fill #0

## 2024-02-23 MED ORDER — OMEPRAZOLE 40 MG PO CPDR
40.0000 mg | DELAYED_RELEASE_CAPSULE | Freq: Every day | ORAL | 1 refills | Status: AC
  Filled 2024-03-10: qty 90, 90d supply, fill #0

## 2024-02-23 MED ORDER — MUPIROCIN 2 % EX OINT: 1.0000 | TOPICAL_OINTMENT | Freq: Two times a day (BID) | CUTANEOUS | 6 refills | Status: AC

## 2024-02-25 ENCOUNTER — Other Ambulatory Visit (HOSPITAL_COMMUNITY): Payer: Self-pay

## 2024-03-10 ENCOUNTER — Other Ambulatory Visit (HOSPITAL_COMMUNITY): Payer: Self-pay

## 2024-03-10 ENCOUNTER — Other Ambulatory Visit: Payer: Self-pay

## 2024-03-10 ENCOUNTER — Encounter: Payer: Self-pay | Admitting: Radiology

## 2024-03-20 ENCOUNTER — Ambulatory Visit (INDEPENDENT_AMBULATORY_CARE_PROVIDER_SITE_OTHER): Admitting: Family Medicine

## 2024-03-20 ENCOUNTER — Other Ambulatory Visit (HOSPITAL_COMMUNITY): Payer: Self-pay

## 2024-03-20 ENCOUNTER — Encounter: Payer: Self-pay | Admitting: Family Medicine

## 2024-03-20 VITALS — BP 130/69 | HR 79 | Ht 67.5 in | Wt 151.0 lb

## 2024-03-20 DIAGNOSIS — E559 Vitamin D deficiency, unspecified: Secondary | ICD-10-CM

## 2024-03-20 DIAGNOSIS — K219 Gastro-esophageal reflux disease without esophagitis: Secondary | ICD-10-CM | POA: Diagnosis not present

## 2024-03-20 DIAGNOSIS — R7301 Impaired fasting glucose: Secondary | ICD-10-CM

## 2024-03-20 DIAGNOSIS — E038 Other specified hypothyroidism: Secondary | ICD-10-CM

## 2024-03-20 DIAGNOSIS — R110 Nausea: Secondary | ICD-10-CM

## 2024-03-20 DIAGNOSIS — I10 Essential (primary) hypertension: Secondary | ICD-10-CM

## 2024-03-20 DIAGNOSIS — E782 Mixed hyperlipidemia: Secondary | ICD-10-CM

## 2024-03-20 MED ORDER — ONDANSETRON HCL 4 MG PO TABS
4.0000 mg | ORAL_TABLET | Freq: Three times a day (TID) | ORAL | 0 refills | Status: AC | PRN
  Filled 2024-03-20: qty 20, 7d supply, fill #0

## 2024-03-20 MED ORDER — PANTOPRAZOLE SODIUM 40 MG PO TBEC
40.0000 mg | DELAYED_RELEASE_TABLET | Freq: Every day | ORAL | 1 refills | Status: AC
  Filled 2024-03-20: qty 60, 60d supply, fill #0
  Filled 2024-05-19: qty 60, 60d supply, fill #1

## 2024-03-20 NOTE — Progress Notes (Signed)
 Established Patient Office Visit  Subjective:  Patient ID: Kimberly Singh, female    DOB: [birth year]  Age: 68 y.o. MRN: [id number]  CC:  Chief Complaint  Patient presents with   Hypertension    Follow up     HPI Kimberly Singh is a 68 y.o. female with past medical history HTN of  presents for f/u of  chronic medical conditions.  For the details of today's visit, please refer to the assessment and plan.     Past Medical History:  Diagnosis Date   Sjogren's syndrome    Spider varicose veins     Past Surgical History:  Procedure Laterality Date   CATARACT EXTRACTION, BILATERAL     CHOLECYSTECTOMY     KNEE ARTHROSCOPY W/ MENISCAL REPAIR Right    TUBAL LIGATION     age 41   WRIST FRACTURE SURGERY Left     Family History  Problem Relation Age of Onset   Obesity Mother    Heart attack Mother    Hypertension Mother    Prostate cancer Father    Obesity Brother    COPD Brother    Alcoholism Brother    Healthy Son    Healthy Son     Social History   Socioeconomic History   Marital status: Married    Spouse name: Not on file   Number of children: Not on file   Years of education: Not on file   Highest education level: Some college, no degree  Occupational History   Not on file  Tobacco Use   Smoking status: Never    Passive exposure: Never   Smokeless tobacco: Never  Vaping Use   Vaping status: Never Used  Substance and Sexual Activity   Alcohol use: Yes    Comment: occ   Drug use: Never   Sexual activity: Yes    Partners: Male  Other Topics Concern   Not on file  Social History Narrative   Moved here about a year and a half ago from [place]   Social Drivers of Health   Financial Resource Strain: Low Risk  (03/16/2024)   Overall Financial Resource Strain (CARDIA)    Difficulty of Paying Living Expenses: Not hard at all  Food Insecurity: No Food Insecurity (03/16/2024)   Hunger Vital Sign    Worried About Running Out of Food in the Last Year: Never  true    Ran Out of Food in the Last Year: Never true  Transportation Needs: No Transportation Needs (03/16/2024)   PRAPARE - Administrator, Civil Service (Medical): No    Lack of Transportation (Non-Medical): No  Physical Activity: Insufficiently Active (03/16/2024)   Exercise Vital Sign    Days of Exercise per Week: 5 days    Minutes of Exercise per Session: 20 min  Stress: No Stress Concern Present (03/16/2024)   [employer] of Occupational Health - Occupational Stress Questionnaire    Feeling of Stress: Not at all  Social Connections: Moderately Integrated (03/16/2024)   Social Connection and Isolation Panel    Frequency of Communication with Friends and Family: More than three times a week    Frequency of Social Gatherings with Friends and Family: Twice a week    Attends Religious Services: 1 to 4 times per year    Active Member of [employer] or Organizations: No    Attends Engineer, Structural: Not on file    Marital Status: Married  Catering Manager Violence: Not At Risk (  09/11/2023)   Humiliation, Afraid, Rape, and Kick questionnaire    Fear of Current or Ex-Partner: No    Emotionally Abused: No    Physically Abused: No    Sexually Abused: No    Outpatient Medications Prior to Visit  Medication Sig Dispense Refill   amLODipine -olmesartan  (AZOR ) 5-20 MG tablet TAKE 1 TABLET BY MOUTH DAILY 90 tablet 0   Biotin 10000 MCG TABS Take by mouth.     Calcium Carbonate-Vitamin D  (CALTRATE 600+D PO) Take by mouth daily.     levothyroxine  (SYNTHROID ) 100 MCG tablet TAKE 1 TABLET(100 MCG) BY MOUTH DAILY 90 tablet 1   levothyroxine  (SYNTHROID ) 100 MCG tablet Take 1 tablet (100 mcg total) by mouth daily. 90 tablet 1   Multiple Vitamin (MULTIVITAMIN ADULT PO) Take by mouth. daily     mupirocin  ointment (BACTROBAN ) 2 % Apply 1 Application topically 2 (two) times daily. 30 g 6   mupirocin  ointment (BACTROBAN ) 2 % Apply 1 Application topically 2 (two) times daily. 30 g 6    omeprazole  (PRILOSEC) 40 MG capsule TAKE 1 CAPSULE(40 MG) BY MOUTH DAILY 90 capsule 1   omeprazole  (PRILOSEC) 40 MG capsule Take 1 capsule (40 mg total) by mouth daily. 90 capsule 1   triamcinolone cream (KENALOG) 0.1 % Apply 1 Application topically 2 (two) times daily.     valACYclovir  (VALTREX ) 1000 MG tablet Take 1 tablet (1000 mg total) by mouth twice daily (BID) for 10 days during outbreaks. 60 tablet 0   No facility-administered medications prior to visit.    No Known Allergies  ROS Review of Systems  Constitutional:  Negative for chills and fever.  Eyes:  Negative for visual disturbance.  Respiratory:  Negative for chest tightness and shortness of breath.   Neurological:  Negative for dizziness and headaches.      Objective:    Physical Exam HENT:     Head: Normocephalic.     Mouth/Throat:     Mouth: Mucous membranes are moist.  Cardiovascular:     Rate and Rhythm: Normal rate.     Heart sounds: Normal heart sounds.  Pulmonary:     Effort: Pulmonary effort is normal.     Breath sounds: Normal breath sounds.  Neurological:     Mental Status: She is alert.     BP 130/69   Pulse 79   Ht 5' 7.5 (1.715 m)   Wt 151 lb (68.5 kg)   SpO2 99%   BMI 23.30 kg/m  Wt Readings from Last 3 Encounters:  03/20/24 151 lb (68.5 kg)  09/24/23 151 lb 3.2 oz (68.6 kg)  09/11/23 150 lb (68 kg)    Lab Results  Component Value Date   TSH 2.850 12/13/2023   Lab Results  Component Value Date   WBC 5.4 09/24/2023   HGB 11.9 09/24/2023   HCT 36.3 09/24/2023   MCV 94.0 09/24/2023   PLT 312 09/24/2023   Lab Results  Component Value Date   NA 138 09/24/2023   K 4.7 09/24/2023   CO2 25 09/24/2023   GLUCOSE 83 09/24/2023   BUN 18 09/24/2023   CREATININE 0.95 09/24/2023   BILITOT 0.3 09/24/2023   ALKPHOS 139 (H) 07/27/2023   AST 29 09/24/2023   ALT 18 09/24/2023   PROT 7.5 09/24/2023   ALBUMIN 4.0 07/27/2023   CALCIUM 9.0 09/24/2023   EGFR 65 09/24/2023   Lab  Results  Component Value Date   CHOL 173 07/27/2023   Lab Results  Component Value Date  HDL 60 07/27/2023   Lab Results  Component Value Date   LDLCALC 99 07/27/2023   Lab Results  Component Value Date   TRIG 77 07/27/2023   Lab Results  Component Value Date   CHOLHDL 2.9 07/27/2023   Lab Results  Component Value Date   HGBA1C 5.7 (H) 07/27/2023      Assessment & Plan:  Primary hypertension Assessment & Plan: Controlled Encouraged to continue taking Olmesartan -Amlodipine  20/5 mg daily.  Low sodium diet with increased physical activity encouraged BP Readings from Last 3 Encounters:  03/20/24 130/69  09/24/23 137/83  08/08/23 138/68      Gastroesophageal reflux disease without esophagitis Assessment & Plan: The patient reports minimal relief with omeprazole  40 mg daily. Will discontinue current therapy and initiate treatment with Protonix 40 mg at bedtime. If symptoms are not adequately controlled, the dose may be increased to 40 mg twice daily (BID). Zofran has been prescribed for nausea.   Orders: -     Pantoprazole Sodium; Take 1 tablet (40 mg total) by mouth daily.  Dispense: 60 tablet; Refill: 1  Nausea -     Ondansetron HCl; Take 1 tablet (4 mg total) by mouth every 8 (eight) hours as needed for nausea or vomiting.  Dispense: 20 tablet; Refill: 0  IFG (impaired fasting glucose) -     Hemoglobin A1c  Vitamin D  deficiency -     VITAMIN D  25 Hydroxy (Vit-D Deficiency, Fractures)  TSH (thyroid -stimulating hormone deficiency) -     TSH + free T4  Mixed hyperlipidemia -     Lipid panel -     CMP14+EGFR -     CBC with Differential/Platelet  Note: This chart has been completed using Engineer, Civil (consulting) software, and while attempts have been made to ensure accuracy, certain words and phrases may not be transcribed as intended.    Follow-up: Return in about 4 months (around 07/18/2024).   Jerita Wimbush  Z Bacchus, FNP

## 2024-03-20 NOTE — Patient Instructions (Addendum)
 I appreciate the opportunity to provide care to you today!    Follow up:  4 months  Fasting Labs: please stop by the lab during the week to get your blood drawn (CBC, CMP, TSH, Lipid profile, HgA1c, Vit D)  Start taking Protonix 40 mg at bedtime.  If symptoms are not adequately controlled, you may increase to 40 mg twice daily (BID).  Zofran has been ordered for nausea  For managing GERD, I recommend the following lifestyle changes:  Avoid Certain Foods and Drinks: Limit or eliminate coffee, chocolate, onions, peppermint, spicy foods, carbonated beverages, citrus fruits, tomatoes, garlic, alcohol, and fatty foods such as bacon, burgers, sausages, steak, fried foods, and dairy products.  Recommended Foods: Increase your intake of high-fiber foods including whole grain cereals, oatmeal, brown rice, root vegetables, and non-citrus fruits. Opt for high-protein foods and healthy fats such as avocados, olive oil, nuts, and seeds.   For a Healthier YOU, I Recommend: Reducing your intake of sugar, sodium, carbohydrates, and saturated fats. Increasing your fiber intake by incorporating more whole grains, fruits, and vegetables into your meals. Setting healthy goals with a focus on lowering your consumption of carbs, sugar, and unhealthy fats. Adding variety to your diet by including a wide range of fruits and vegetables. Cutting back on soda and limiting processed foods as much as possible. Staying active: In addition to taking your weight loss medication, aim for at least 150 minutes of moderate-intensity physical activity each week for optimal results.    Please follow up if your symptoms worsen or fail to improve.    Please continue to a heart-healthy diet and increase your physical activities. Try to exercise for at least five days a week.    It was a pleasure to see you and I look forward to continuing to work together on your health and well-being. Please do not hesitate to  call the office if you need care or have questions about your care.  In case of emergency, please visit the Emergency Department for urgent care, or contact our clinic at [phone] to schedule an appointment. We're here to help you!   Have a wonderful day and week. With Gratitude, Meade JENEANE Gerlach MSN, FNP-BC, PMHNP-BC

## 2024-03-20 NOTE — Assessment & Plan Note (Signed)
 The patient reports minimal relief with omeprazole  40 mg daily. Will discontinue current therapy and initiate treatment with Protonix 40 mg at bedtime. If symptoms are not adequately controlled, the dose may be increased to 40 mg twice daily (BID). Zofran has been prescribed for nausea.

## 2024-03-20 NOTE — Assessment & Plan Note (Signed)
 Controlled Encouraged to continue taking Olmesartan -Amlodipine  20/5 mg daily.  Low sodium diet with increased physical activity encouraged BP Readings from Last 3 Encounters:  03/20/24 130/69  09/24/23 137/83  08/08/23 138/68

## 2024-03-21 ENCOUNTER — Other Ambulatory Visit: Payer: Self-pay

## 2024-04-29 ENCOUNTER — Other Ambulatory Visit: Payer: Self-pay | Admitting: Family Medicine

## 2024-04-29 DIAGNOSIS — I10 Essential (primary) hypertension: Secondary | ICD-10-CM

## 2024-04-29 MED ORDER — AMLODIPINE-OLMESARTAN 5-20 MG PO TABS: 1.0000 | ORAL_TABLET | Freq: Every day | ORAL | 0 refills | Status: AC

## 2024-04-29 NOTE — Telephone Encounter (Signed)
 Copied from CRM [id number]. Topic: Clinical - Medication Refill >> Apr 29, 2024 10:19 AM Mia F wrote: Medication: amLODipine -olmesartan  (AZOR ) 5-20 MG tablet   Has the patient contacted their pharmacy? No (Agent: If no, request that the patient contact the pharmacy for the refill. If patient does not wish to contact the pharmacy document the reason why and proceed with request.) (Agent: If yes, when and what did the pharmacy advise?)  This is the patient's preferred pharmacy:  [id number] - [place], [place] - [address] AT [address] [place] [address] Phone: [phone] Fax: [phone]  Is this the correct pharmacy for this prescription? Yes If no, delete pharmacy and type the correct one.   Has the prescription been filled recently? Yes  Is the patient out of the medication? Yes  Has the patient been seen for an appointment in the last year OR does the patient have an upcoming appointment? Yes  Can we respond through MyChart? No  Agent: Please be advised that Rx refills may take up to 3 business days. We ask that you follow-up with your pharmacy.

## 2024-05-02 ENCOUNTER — Other Ambulatory Visit (HOSPITAL_COMMUNITY): Payer: Self-pay

## 2024-05-19 ENCOUNTER — Other Ambulatory Visit (HOSPITAL_COMMUNITY): Payer: Self-pay

## 2024-07-16 ENCOUNTER — Ambulatory Visit: Admitting: Family Medicine

## 2024-09-15 ENCOUNTER — Ambulatory Visit
